# Patient Record
Sex: Female | Born: 1980 | Race: Black or African American | Hispanic: No | Marital: Married | Smoking: Current every day smoker
Health system: Southern US, Community
[De-identification: ages and names within clinical notes are randomized; demographics above are authoritative.]

## PROBLEM LIST (undated history)

## (undated) DIAGNOSIS — Z720 Tobacco use: Secondary | ICD-10-CM

## (undated) DIAGNOSIS — F319 Bipolar disorder, unspecified: Secondary | ICD-10-CM

## (undated) DIAGNOSIS — R569 Unspecified convulsions: Secondary | ICD-10-CM

## (undated) DIAGNOSIS — L0591 Pilonidal cyst without abscess: Secondary | ICD-10-CM

## (undated) HISTORY — PX: EYE SURGERY: SHX253

---

## 2005-12-14 ENCOUNTER — Emergency Department: Payer: Self-pay | Admitting: Emergency Medicine

## 2006-12-03 ENCOUNTER — Emergency Department: Payer: Self-pay | Admitting: Internal Medicine

## 2006-12-03 ENCOUNTER — Other Ambulatory Visit: Payer: Self-pay

## 2013-04-24 ENCOUNTER — Ambulatory Visit: Payer: Self-pay | Admitting: Family Medicine

## 2013-04-30 ENCOUNTER — Ambulatory Visit: Payer: Self-pay

## 2013-04-30 LAB — COMPREHENSIVE METABOLIC PANEL
ALK PHOS: 48 U/L
ALT: 51 U/L (ref 12–78)
AST: 65 U/L — AB (ref 15–37)
Albumin: 4.2 g/dL (ref 3.4–5.0)
Anion Gap: 11 (ref 7–16)
BILIRUBIN TOTAL: 0.9 mg/dL (ref 0.2–1.0)
BUN: 11 mg/dL (ref 7–18)
CHLORIDE: 102 mmol/L (ref 98–107)
CREATININE: 1.02 mg/dL (ref 0.60–1.30)
Calcium, Total: 9.2 mg/dL (ref 8.5–10.1)
Co2: 26 mmol/L (ref 21–32)
EGFR (Non-African Amer.): >60
Glucose: 89 mg/dL (ref 65–99)
OSMOLALITY: 276 (ref 275–301)
POTASSIUM: 4 mmol/L (ref 3.5–5.1)
Sodium: 139 mmol/L (ref 136–145)
Total Protein: 8.6 g/dL — ABNORMAL HIGH (ref 6.4–8.2)

## 2013-04-30 LAB — CBC WITH DIFFERENTIAL/PLATELET
Basophil #: 0 10*3/uL (ref 0.0–0.1)
Basophil %: 0.7 %
EOS ABS: 0.1 10*3/uL (ref 0.0–0.7)
Eosinophil %: 1.1 %
HCT: 38.7 % (ref 35.0–47.0)
HGB: 12.5 g/dL (ref 12.0–16.0)
LYMPHS ABS: 1.9 10*3/uL (ref 1.0–3.6)
LYMPHS PCT: 36.3 %
MCH: 29.8 pg (ref 26.0–34.0)
MCHC: 32.2 g/dL (ref 32.0–36.0)
MCV: 93 fL (ref 80–100)
MONOS PCT: 4.9 %
Monocyte #: 0.3 x10 3/mm (ref 0.2–0.9)
Neutrophil #: 3 10*3/uL (ref 1.4–6.5)
Neutrophil %: 57 %
Platelet: 229 10*3/uL (ref 150–440)
RBC: 4.18 10*6/uL (ref 3.80–5.20)
RDW: 12.7 % (ref 11.5–14.5)
WBC: 5.2 10*3/uL (ref 3.6–11.0)

## 2013-04-30 LAB — CK TOTAL AND CKMB (NOT AT ARMC)
CK, Total: 1929 U/L — ABNORMAL HIGH
CK-MB: <0.5 ng/mL — ABNORMAL LOW (ref 0.5–3.6)

## 2013-04-30 LAB — D-DIMER(ARMC): D-Dimer: 252 ng/ml

## 2015-04-06 DIAGNOSIS — H10022 Other mucopurulent conjunctivitis, left eye: Secondary | ICD-10-CM | POA: Diagnosis not present

## 2015-05-25 ENCOUNTER — Ambulatory Visit: Payer: Self-pay | Admitting: Family

## 2015-05-25 ENCOUNTER — Encounter: Payer: Self-pay | Admitting: Physician Assistant

## 2015-05-25 VITALS — BP 100/72 | HR 80 | Temp 98.2°F

## 2015-05-25 DIAGNOSIS — J392 Other diseases of pharynx: Secondary | ICD-10-CM

## 2015-05-25 NOTE — Progress Notes (Signed)
S/ c/o mild ST x 3-4 days without any fever ENT, RESP sxs   O/ VSS alert pleasant NAD ENT throat is clear, neck supple without nodes heart rsr lungs clear A/ throat irritation normal exam P / salt water gargles prn. F/U prn further problems

## 2015-06-01 ENCOUNTER — Emergency Department (HOSPITAL_COMMUNITY)
Admission: EM | Admit: 2015-06-01 | Discharge: 2015-06-01 | Disposition: A | Discharge status: Home / Self Care | Payer: 59 | Attending: Emergency Medicine | Admitting: Emergency Medicine

## 2015-06-01 ENCOUNTER — Encounter (HOSPITAL_COMMUNITY): Payer: Self-pay | Admitting: Emergency Medicine

## 2015-06-01 DIAGNOSIS — S29011A Strain of muscle and tendon of front wall of thorax, initial encounter: Secondary | ICD-10-CM | POA: Insufficient documentation

## 2015-06-01 DIAGNOSIS — Y9389 Activity, other specified: Secondary | ICD-10-CM | POA: Diagnosis not present

## 2015-06-01 DIAGNOSIS — F17200 Nicotine dependence, unspecified, uncomplicated: Secondary | ICD-10-CM | POA: Insufficient documentation

## 2015-06-01 DIAGNOSIS — Y9289 Other specified places as the place of occurrence of the external cause: Secondary | ICD-10-CM | POA: Insufficient documentation

## 2015-06-01 DIAGNOSIS — Y998 Other external cause status: Secondary | ICD-10-CM | POA: Insufficient documentation

## 2015-06-01 DIAGNOSIS — X58XXXA Exposure to other specified factors, initial encounter: Secondary | ICD-10-CM | POA: Insufficient documentation

## 2015-06-01 DIAGNOSIS — M25512 Pain in left shoulder: Secondary | ICD-10-CM | POA: Diagnosis present

## 2015-06-01 HISTORY — DX: Unspecified convulsions: R56.9

## 2015-06-01 MED ORDER — IBUPROFEN 600 MG PO TABS: 600.0000 mg | ORAL_TABLET | Freq: Four times a day (QID) | ORAL | Status: DC | PRN

## 2015-06-01 NOTE — Discharge Instructions (Signed)

## 2015-06-01 NOTE — ED Provider Notes (Signed)
CSN: [id number]     Arrival date & time 06/01/15  0909 History   First MD Initiated Contact with Patient 06/01/15 0920     Chief Complaint  Patient presents with  . Chest Pain     (Consider location/radiation/quality/duration/timing/severity/associated sxs/prior Treatment) Patient is a 35 y.o. female presenting with chest pain. The history is provided by the patient.  Chest Pain Pain location:  L lateral chest (medial to shoulder) Pain quality: aching   Pain radiates to:  Does not radiate Pain radiates to the back: no   Pain severity:  Moderate Onset quality:  Gradual Duration:  3 hours Timing:  Constant Progression:  Unchanged Chronicity:  Recurrent (same pain as when "tore my muscle" at end of last year) Context: not lifting and no movement   Relieved by:  Nothing Worsened by:  Nothing tried Ineffective treatments:  None tried Associated symptoms: no abdominal pain (but has indigestion), no cough, no fever, no nausea and not vomiting   Risk factors: smoking (0.5 ppd)   Risk factors: no diabetes mellitus, no high cholesterol and no hypertension     Past Medical History  Diagnosis Date  . Seizures (HCC)    No past surgical history on file. No family history on file. Social History  Substance Use Topics  . Smoking status: Current Some Day Smoker  . Smokeless tobacco: None  . Alcohol Use: None   OB History    No data available     Review of Systems  Constitutional: Negative for fever.  Respiratory: Negative for cough.   Cardiovascular: Positive for chest pain.  Gastrointestinal: Negative for nausea, vomiting and abdominal pain (but has indigestion).  All other systems reviewed and are negative.     Allergies  Review of patient's allergies indicates no known allergies.  Home Medications   Prior to Admission medications   Not on File   BP 121/81 mmHg  Pulse 84  Temp(Src) 98.4 F (36.9 C) (Oral)  Resp 16  Wt 127 lb (57.607 kg)  SpO2 100% Physical  Exam  Constitutional: She is oriented to person, place, and time. She appears well-developed and well-nourished. No distress.  HENT:  Head: Normocephalic.  Eyes: Conjunctivae are normal.  Neck: Neck supple. No tracheal deviation present.  Cardiovascular: Normal rate, regular rhythm and normal heart sounds.   Pulmonary/Chest: Effort normal and breath sounds normal. No respiratory distress. She has no wheezes. She has no rales.  Abdominal: Soft. She exhibits no distension.  Musculoskeletal:       Left shoulder: She exhibits tenderness (over left lateral pectoralis muscle with palpation and shoulder extension).  Neurological: She is alert and oriented to person, place, and time.  Skin: Skin is warm and dry.  Psychiatric: She has a normal mood and affect.  Vitals reviewed.   ED Course  Procedures (including critical care time) Labs Review Labs Reviewed - No data to display  Imaging Review No results found. I have personally reviewed and evaluated these images and lab results as part of my medical decision-making.   EKG Interpretation   Date/Time:  Wednesday Jun 01 2015 09:14:24 EDT Ventricular Rate:  91 PR Interval:  140 QRS Duration: 60 QT Interval:  348 QTC Calculation: 428 R Axis:   77 Text Interpretation:  Normal sinus rhythm Normal ECG No significant change  since last tracing Confirmed by Harmonie Verrastro MD, Chrsitopher Wik ([id number]) on 06/01/2015  9:22:51 AM      MDM   Final diagnoses:  Strain of left pectoralis muscle, initial encounter  35 y.o. female presents with left shoulder pain at her left upper chest. Tender to palpation of musculature which reproduces symptoms. Highly atypical for cardiac etiology and EKG is reassuring. Patient was recommended to take short course of scheduled NSAIDs and engage in early mobility as definitive treatment. Plan to follow up with PCP as needed and return precautions discussed for worsening or new concerning symptoms.     Lyndal Pulleyaniel Keyasha Miah,  MD 06/01/15 [phone]

## 2015-06-01 NOTE — ED Notes (Signed)
NAD at this time. Pt is going home.   

## 2015-06-01 NOTE — ED Notes (Signed)
Was cleaning shed Friday and sat and  This am left side cp started  this am had some sob  Fingers left hand are numb and tingling  States when she pushes on her chest it feels better denies n/v

## 2015-06-17 DIAGNOSIS — H5319 Other subjective visual disturbances: Secondary | ICD-10-CM | POA: Diagnosis not present

## 2015-06-17 DIAGNOSIS — H43392 Other vitreous opacities, left eye: Secondary | ICD-10-CM | POA: Diagnosis not present

## 2015-09-08 DIAGNOSIS — J0100 Acute maxillary sinusitis, unspecified: Secondary | ICD-10-CM | POA: Diagnosis not present

## 2015-09-08 DIAGNOSIS — Z6821 Body mass index (BMI) 21.0-21.9, adult: Secondary | ICD-10-CM | POA: Diagnosis not present

## 2015-09-26 DIAGNOSIS — Z6823 Body mass index (BMI) 23.0-23.9, adult: Secondary | ICD-10-CM | POA: Diagnosis not present

## 2015-09-26 DIAGNOSIS — L0501 Pilonidal cyst with abscess: Secondary | ICD-10-CM | POA: Diagnosis not present

## 2015-10-07 DIAGNOSIS — L0501 Pilonidal cyst with abscess: Secondary | ICD-10-CM | POA: Diagnosis not present

## 2015-10-17 DIAGNOSIS — L0501 Pilonidal cyst with abscess: Secondary | ICD-10-CM | POA: Diagnosis not present

## 2015-10-17 DIAGNOSIS — Z6822 Body mass index (BMI) 22.0-22.9, adult: Secondary | ICD-10-CM | POA: Diagnosis not present

## 2015-10-28 DIAGNOSIS — Z23 Encounter for immunization: Secondary | ICD-10-CM | POA: Diagnosis not present

## 2015-12-10 ENCOUNTER — Ambulatory Visit
Admission: EM | Admit: 2015-12-10 | Discharge: 2015-12-10 | Disposition: A | Discharge status: Home / Self Care | Payer: 59 | Attending: Family Medicine | Admitting: Family Medicine

## 2015-12-10 ENCOUNTER — Encounter: Payer: Self-pay | Admitting: Emergency Medicine

## 2015-12-10 DIAGNOSIS — J069 Acute upper respiratory infection, unspecified: Secondary | ICD-10-CM | POA: Diagnosis not present

## 2015-12-10 DIAGNOSIS — J029 Acute pharyngitis, unspecified: Secondary | ICD-10-CM

## 2015-12-10 LAB — RAPID STREP SCREEN (MED CTR MEBANE ONLY): Streptococcus, Group A Screen (Direct): NEGATIVE

## 2015-12-10 MED ORDER — HYDROCOD POLST-CPM POLST ER 10-8 MG/5ML PO SUER: 5.0000 mL | Freq: Every evening | ORAL | 0 refills | Status: DC | PRN

## 2015-12-10 MED ORDER — LIDOCAINE VISCOUS 2 % MT SOLN: 15.0000 mL | Freq: Three times a day (TID) | OROMUCOSAL | 0 refills | Status: DC | PRN

## 2015-12-10 NOTE — ED Provider Notes (Signed)
[facility] ____________________________________________  Time seen: Approximately 8:38 AM  I have reviewed the triage vital signs and the nursing notes.   HISTORY  Chief Complaint Sore Throat   HPI Joanne Tate is a 35 y.o. female presents for the complaints of 3-4 days of sore throat, cough and congestion. Patient reports symptoms unresolved with over-the-counter DayQuil. Denies fevers. Reports continues to eat and drink well. Reports has continued to remain active. Reports multiple sick contacts recently as she is frequently around young children. States cough intermittently does wake her up at night. States occasional production of mucus, but reports overall dry cough. States sore throat currently has somewhat improved since this morning sore throat.  Denies chest pain, shortness of breath, abdominal pain, dysuria, neck or back pain.  Patient's last menstrual period was 11/16/2015 (approximate). Denies pregnancy.   Past Medical History:  Diagnosis Date  . Seizures (HCC)     There are no active problems to display for this patient.   History reviewed. No pertinent surgical history.  Current Outpatient Rx  . Order #: [id number] Class: Print  . Order #: [id number] Class: Print  . Order #: [id number] Class: Normal    No current facility-administered medications for this encounter.   Current Outpatient Prescriptions:  .  chlorpheniramine-HYDROcodone (TUSSIONEX PENNKINETIC ER) 10-8 MG/5ML SUER, Take 5 mLs by mouth at bedtime as needed. do not drive or operate machinery while taking as can cause drowsiness., Disp: 75 mL, Rfl: 0 .  ibuprofen (ADVIL,MOTRIN) 600 MG tablet, Take 1 tablet (600 mg total) by mouth every 6 (six) hours as needed., Disp: 30 tablet, Rfl: 0 .  lidocaine (XYLOCAINE) 2 % solution, Use as directed 15 mLs in the mouth or throat every 8 (eight) hours as needed (sore throat. gargle and spit as needed for sore throat.)., Disp: 85 mL, Rfl:  0  Allergies Hydrocortisone  History reviewed. No pertinent family history.  Social History Social History  Substance Use Topics  . Smoking status: Current Some Day Smoker  . Smokeless tobacco: Current User  . Alcohol use 0.0 oz/week    Review of Systems Constitutional: No fever/chills Eyes: No visual changes. ENT: As above. Cardiovascular: Denies chest pain. Respiratory: Denies shortness of breath. Gastrointestinal: No abdominal pain.  No nausea, no vomiting.  No diarrhea.  No constipation. Genitourinary: Negative for dysuria. Musculoskeletal: Negative for back pain. Skin: Negative for rash. Neurological: Negative for headaches, focal weakness or numbness.  10-point ROS otherwise negative.  ____________________________________________   PHYSICAL EXAM:  VITAL SIGNS: ED Triage Vitals  Enc Vitals Group     BP 12/10/15 0827 108/73     Pulse Rate 12/10/15 0827 95     Resp 12/10/15 0827 16     Temp 12/10/15 0827 98.1 F (36.7 C)     Temp Source 12/10/15 0827 Oral     SpO2 12/10/15 0827 100 %     Weight 12/10/15 0827 131 lb (59.4 kg)     Height 12/10/15 0827 5\' 6"  (1.676 m)     Head Circumference --      Peak Flow --      Pain Score 12/10/15 0830 4     Pain Loc --      Pain Edu? --      Excl. in GC? --    Constitutional: Alert and oriented. Well appearing and in no acute distress. Eyes: Conjunctivae are normal. PERRL. EOMI. Head: Atraumatic. No sinus tenderness to palpation. No swelling. No erythema.  Ears: no erythema, normal TMs bilaterally.  Nose:Nasal congestion, no rhinorrhea.   Mouth/Throat: Mucous membranes are moist. Mild pharyngeal erythema. No tonsillar swelling or exudate.  Neck: No stridor.  No cervical spine tenderness to palpation. Hematological/Lymphatic/Immunilogical: No cervical lymphadenopathy. Cardiovascular: Normal rate, regular rhythm. Grossly normal heart sounds.  Good peripheral circulation. Respiratory: Normal respiratory effort.  No  retractions. Lungs CTAB.No wheezes, rales or rhonchi. Good air movement. Occasional dry cough noted in room.  Gastrointestinal: Soft and nontender.No CVA tenderness. Musculoskeletal: No lower or upper extremity tenderness nor edema. No cervical, thoracic or lumbar tenderness to palpation. Neurologic:  Normal speech and language. No gross focal neurologic deficits are appreciated. No gait instability. Skin:  Skin is warm, dry and intact. No rash noted. Psychiatric: Mood and affect are normal. Speech and behavior are normal. ___________________________________________   LABS (all labs ordered are listed, but only abnormal results are displayed)  Labs Reviewed  RAPID STRE[facility]EN [facility])  CULTURE, GROUP A STREP [facility])    RADIOLOGY  No results found. ____________________________________________   PROCEDURES Procedures   INITIAL IMPRESSION / ASSESSMENT AND PLAN / ED COURSE  Pertinent labs & imaging results that were available during my care of the patient were reviewed by me and considered in my medical decision making (see chart for details).  Well-appearing patient. No acute distress. States viral upper respiratory infection and viral pharyngitis. Will treat patient supportively. Over-the-counter Sudafed, prn viscous lidocaine gargles and when necessary Tussionex at night. Encouraged rest, fluids PCP follow up as needed.  Discussed follow up with Primary care physician this week. Discussed follow up and return parameters including no resolution or any worsening concerns. Patient verbalized understanding and agreed to plan.   ____________________________________________   FINAL CLINICAL IMPRESSION(S) / ED DIAGNOSES  Final diagnoses:  Upper respiratory tract infection, unspecified type  Pharyngitis, unspecified etiology     Discharge Medication List as of 12/10/2015  8:55 AM    START taking these medications   Details  chlorpheniramine-HYDROcodone (TUSSIONEX  PENNKINETIC ER) 10-8 MG/5ML SUER Take 5 mLs by mouth at bedtime as needed. do not drive or operate machinery while taking as can cause drowsiness., Starting Sat 12/10/2015, Print    lidocaine (XYLOCAINE) 2 % solution Use as directed 15 mLs in the mouth or throat every 8 (eight) hours as needed (sore throat. gargle and spit as needed for sore throat.)., Starting Sat 12/10/2015, Normal        Note: This dictation was prepared with Dragon dictation along with smaller phrase technology. Any transcriptional errors that result from this process are unintentional.    Clinical Course       Renford DillsLindsey Avanna Sowder, NP 12/10/15 [phone]

## 2015-12-10 NOTE — Discharge Instructions (Signed)
Take medication as prescribed. Rest. Drink plenty of fluids.  ° °Follow up with your primary care physician this week as needed. Return to Urgent care for new or worsening concerns.  ° °

## 2015-12-10 NOTE — ED Triage Notes (Signed)
Patient c/o sore throat, cough and chest congestion since yesterday.  Patient denies fevers.

## 2015-12-13 ENCOUNTER — Telehealth: Payer: Self-pay

## 2015-12-13 LAB — CULTURE, GROUP A STREP (THRC)

## 2015-12-13 NOTE — Telephone Encounter (Signed)
Courtesy call back completed today for patients recent visit at [facility]. Patient did not answer, left message on voicemail to call back with any questions or concerns.   

## 2016-02-13 DIAGNOSIS — F3161 Bipolar disorder, current episode mixed, mild: Secondary | ICD-10-CM | POA: Diagnosis not present

## 2016-02-13 DIAGNOSIS — Z6821 Body mass index (BMI) 21.0-21.9, adult: Secondary | ICD-10-CM | POA: Diagnosis not present

## 2017-12-04 ENCOUNTER — Ambulatory Visit
Admission: EM | Admit: 2017-12-04 | Discharge: 2017-12-04 | Disposition: A | Discharge status: Home / Self Care | Payer: 59 | Attending: Emergency Medicine | Admitting: Emergency Medicine

## 2017-12-04 ENCOUNTER — Other Ambulatory Visit: Payer: Self-pay

## 2017-12-04 DIAGNOSIS — J069 Acute upper respiratory infection, unspecified: Secondary | ICD-10-CM

## 2017-12-04 LAB — RAPID INFLUENZA A&B ANTIGENS (ARMC ONLY): INFLUENZA B (ARMC): NEGATIVE

## 2017-12-04 LAB — RAPID STREP SCREEN (MED CTR MEBANE ONLY): Streptococcus, Group A Screen (Direct): NEGATIVE

## 2017-12-04 LAB — RAPID INFLUENZA A&B ANTIGENS: Influenza A (ARMC): NEGATIVE

## 2017-12-04 MED ORDER — FLUTICASONE PROPIONATE 50 MCG/ACT NA SUSP: 2.0000 | Freq: Every day | NASAL | 0 refills | Status: DC

## 2017-12-04 MED ORDER — HYDROCOD POLST-CPM POLST ER 10-8 MG/5ML PO SUER: 5.0000 mL | Freq: Two times a day (BID) | ORAL | 0 refills | Status: DC

## 2017-12-04 MED ORDER — CETIRIZINE-PSEUDOEPHEDRINE ER 5-120 MG PO TB12: 1.0000 | ORAL_TABLET | Freq: Two times a day (BID) | ORAL | 0 refills | Status: DC

## 2017-12-04 MED ORDER — BENZONATATE 200 MG PO CAPS: ORAL_CAPSULE | ORAL | 0 refills | Status: DC

## 2017-12-04 NOTE — Discharge Instructions (Signed)
Drink plenty of fluids.  Get adequate rest.

## 2017-12-04 NOTE — ED Triage Notes (Signed)
Patient complains of cough, congestion, body aches, sinus pain and pressure, and sore throat that started yesterday.

## 2017-12-04 NOTE — ED Provider Notes (Signed)
[facility]    CSN: [id number] Arrival date & time: 12/04/17  1658     History   Chief Complaint Chief Complaint  Patient presents with  . Sore Throat    HPI Joanne Tate is a 37 y.o. female.   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  She did not receive a flu shot this year stating that she is afraid of needles.  Said chills but has no measurable fever today.  O2 sats 100%.  Her cough is mildly productive.  Denies wheezing or shortness of breath.       Past Medical History:  Diagnosis Date  . Seizures (HCC)     There are no active problems to display for this patient.   Past Surgical History:  Procedure Laterality Date  . EYE SURGERY Left    cornea laceration    OB History   None      Home Medications    Prior to Admission medications   Medication Sig Start Date End Date Taking? Authorizing Provider  sertraline (ZOLOFT) 50 MG tablet Take 50 mg by mouth daily. 09/02/17  Yes [provider]  valproic acid (DEPAKENE) 250 MG capsule Take 250 mg by mouth 2 (two) times daily. 09/02/17  Yes [provider]  benzonatate (TESSALON) 200 MG capsule Take one cap TID PRN cough 12/04/17   Lutricia Feiloemer, William P, PA-C  cetirizine-pseudoephedrine (ZYRTEC-D) 5-120 MG tablet Take 1 tablet by mouth 2 (two) times daily. 12/04/17   Lutricia Feiloemer, William P, PA-C  chlorpheniramine-HYDROcodone (TUSSIONEX PENNKINETIC ER) 10-8 MG/5ML SUER Take 5 mLs by mouth 2 (two) times daily. 12/04/17   Lutricia Feiloemer, William P, PA-C  fluticasone (FLONASE) 50 MCG/ACT nasal spray Place 2 sprays into both nostrils daily. 12/04/17   Lutricia Feiloemer, William P, PA-C    Family History History reviewed. No pertinent family history.  Social History Social History    Tobacco Use  . Smoking status: Current Some Day Smoker    Packs/day: 0.50    Types: Cigarettes  . Smokeless tobacco: Never Used  Substance Use Topics  . Alcohol use: Yes    Alcohol/week: 0.0 standard drinks    Comment: occasionally  . Drug use: No     Allergies   Hydrocortisone   Review of Systems Review of Systems  Constitutional: Positive for activity change, chills and fatigue. Negative for appetite change, diaphoresis and fever.  HENT: Positive for congestion, postnasal drip, sinus pressure, sinus pain and sore throat.   Respiratory: Positive for cough.   All other systems reviewed and are negative.    Physical Exam Triage Vital Signs ED Triage Vitals  Enc Vitals Group     BP 12/04/17 1722 104/74     Pulse Rate 12/04/17 1722 83     Resp 12/04/17 1722 18     Temp 12/04/17 1722 98 F (36.7 C)     Temp Source 12/04/17 1722 Oral     SpO2 12/04/17 1722 100 %     Weight 12/04/17 1719 132 lb (59.9 kg)     Height 12/04/17 1719 5\' 6"  (1.676 m)     Head Circumference --      Peak Flow --      Pain Score 12/04/17 1718 6     Pain Loc --  Pain Edu? --      Excl. in GC? --    No data found.  Updated Vital Signs BP 104/74 (BP Location: Left Arm)   Pulse 83   Temp 98 F (36.7 C) (Oral)   Resp 18   Ht 5\' 6"  (1.676 m)   Wt 132 lb (59.9 kg)   LMP 11/25/2017   SpO2 100%   BMI 21.31 kg/m   Visual Acuity Right Eye Distance:   Left Eye Distance:   Bilateral Distance:    Right Eye Near:   Left Eye Near:    Bilateral Near:     Physical Exam  Constitutional: She is oriented to person, place, and time. She appears well-developed and well-nourished.  Non-toxic appearance. She does not appear ill. No distress.  HENT:  Head: Normocephalic.  Right Ear: Hearing normal.  Left Ear: Hearing, tympanic membrane and ear canal normal.  Mouth/Throat: Uvula is midline, oropharynx is clear and moist and mucous membranes are normal. No oral lesions. No uvula swelling. No  oropharyngeal exudate, posterior oropharyngeal edema, posterior oropharyngeal erythema or tonsillar abscesses. Tonsils are 0 on the right. Tonsils are 0 on the left. No tonsillar exudate.  Right ear canal is occluded with cerumen  Eyes: Pupils are equal, round, and reactive to light.  Neck: Normal range of motion.  Pulmonary/Chest: Effort normal and breath sounds normal.  Lymphadenopathy:    She has no cervical adenopathy.  Neurological: She is alert and oriented to person, place, and time.  Skin: Skin is warm and dry.  Psychiatric: She has a normal mood and affect. Her behavior is normal.  Nursing note and vitals reviewed.    UC Treatments / Results  Labs (all labs ordered are listed, but only abnormal results are displayed) Labs Reviewed  RAPID STREP SCREEN ([facility] ONLY)  RAPID INFLUENZA A&B ANTIGENS ([facility] ONLY)  CULTURE, GROUP A STREP [facility])    EKG None  Radiology No results found.  Procedures Procedures (including critical care time)  Medications Ordered in UC Medications - No data to display  Initial Impression / Assessment and Plan / UC Course  I have reviewed the triage vital signs and the nursing notes.  Pertinent labs & imaging results that were available during my care of the patient were reviewed by me and considered in my medical decision making (see chart for details).     The patient that this is likely a viral illness and does not require antibiotics at this time.  Treat her symptomatically.  Flonase nasal spray.  Recommend Zyrtec-D for decongestion.  Provide her with cough suppression.  If she continues to worsen or is not improving she can return to our clinic for further evaluation.  Is out of work through Monday. Final Clinical Impressions(s) / UC Diagnoses   Final diagnoses:  Upper respiratory tract infection, unspecified type     Discharge Instructions     Drink plenty of fluids.  Get adequate rest.    ED Prescriptions     Medication Sig Dispense Auth. Provider   benzonatate (TESSALON) 200 MG capsule Take one cap TID PRN cough 30 capsule Lutricia Feil, PA-C   chlorpheniramine-HYDROcodone (TUSSIONEX PENNKINETIC ER) 10-8 MG/5ML SUER Take 5 mLs by mouth 2 (two) times daily. 115 mL Ovid Curd P, PA-C   cetirizine-pseudoephedrine (ZYRTEC-D) 5-120 MG tablet Take 1 tablet by mouth 2 (two) times daily. 30 tablet Ovid Curd P, PA-C   fluticasone (FLONASE) 50 MCG/ACT nasal spray Place 2 sprays into both nostrils  daily. 16 g Lutricia Feil, PA-C     Controlled Substance Prescriptions [place] Controlled Substance Registry consulted? Not Applicable   Lutricia Feil, PA-C 12/04/17 1801

## 2017-12-07 LAB — CULTURE, GROUP A STREP (THRC)

## 2017-12-08 ENCOUNTER — Telehealth: Payer: Self-pay | Admitting: Emergency Medicine

## 2017-12-08 MED ORDER — HYDROCOD POLST-CPM POLST ER 10-8 MG/5ML PO SUER: 5.0000 mL | Freq: Two times a day (BID) | ORAL | 0 refills | Status: DC

## 2017-12-08 NOTE — Telephone Encounter (Signed)
Notified by staff [facility], staff changed the pharmacy to [facility] [place].  Will write another prescription of Tussionex 5 mL twice a day #60 and send it to [facility].  Domenick GongAshley Keithan Dileonardo, MD.

## 2017-12-12 ENCOUNTER — Ambulatory Visit: Payer: Self-pay | Admitting: Obstetrics & Gynecology

## 2018-06-21 ENCOUNTER — Ambulatory Visit
Admission: EM | Admit: 2018-06-21 | Discharge: 2018-06-21 | Disposition: A | Discharge status: Home / Self Care | Payer: Self-pay | Attending: Family Medicine | Admitting: Family Medicine

## 2018-06-21 ENCOUNTER — Other Ambulatory Visit: Payer: Self-pay

## 2018-06-21 ENCOUNTER — Encounter: Payer: Self-pay | Admitting: Emergency Medicine

## 2018-06-21 DIAGNOSIS — H00011 Hordeolum externum right upper eyelid: Secondary | ICD-10-CM

## 2018-06-21 DIAGNOSIS — L03213 Periorbital cellulitis: Secondary | ICD-10-CM

## 2018-06-21 MED ORDER — SULFAMETHOXAZOLE-TRIMETHOPRIM 800-160 MG PO TABS: 1.0000 | ORAL_TABLET | Freq: Two times a day (BID) | ORAL | 0 refills | Status: DC

## 2018-06-21 NOTE — ED Triage Notes (Signed)
Patient c/o swelling, itching and tenderness in her right eye for the past 2 days.  Patient denies fever.

## 2018-06-21 NOTE — ED Provider Notes (Signed)
[facility]    CSN: [id number] Arrival date & time: 06/21/18  1011     History   Chief Complaint Chief Complaint  Patient presents with  . Eye Problem    HPI Joanne Tate is a 38 y.o. female.   38 yo female with a c/o swelling, itching and tenderness to her right upper eyelid for the past 2 days. States this morning she had some crustiness and drainage. Denies any trauma/injury to the eye, fevers, chills.      Past Medical History:  Diagnosis Date  . Seizures ([place])     There are no active problems to display for this patient.   Past Surgical History:  Procedure Laterality Date  . EYE SURGERY Left    cornea laceration    OB History   No obstetric history on file.      Home Medications    Prior to Admission medications   Medication Sig Start Date End Date Taking? Authorizing Provider  ALPRAZolam (XANAX) 0.25 MG tablet 1 TABLET(S) ORAL TWO TIMES A DAY AS NEEDED FOR ANXIETY 05/01/18  Yes [provider]  fluticasone (FLONASE) 50 MCG/ACT nasal spray Place 2 sprays into both nostrils daily. 12/04/17  Yes Lorin Picket, PA-C  sertraline (ZOLOFT) 50 MG tablet Take 50 mg by mouth daily. 09/02/17  Yes [provider]  valproic acid (DEPAKENE) 250 MG capsule Take 250 mg by mouth 2 (two) times daily. 09/02/17  Yes [provider]  benzonatate (TESSALON) 200 MG capsule Take one cap TID PRN cough 12/04/17   Lorin Picket, PA-C  cetirizine-pseudoephedrine (ZYRTEC-D) 5-120 MG tablet Take 1 tablet by mouth 2 (two) times daily. 12/04/17   Lorin Picket, PA-C  chlorpheniramine-HYDROcodone (TUSSIONEX PENNKINETIC ER) 10-8 MG/5ML SUER Take 5 mLs by mouth 2 (two) times daily. 12/08/17   Melynda Ripple, MD  sulfamethoxazole-trimethoprim (BACTRIM DS) 800-160 MG tablet Take 1 tablet by mouth 2 (two) times daily. 06/21/18   Norval Gable, MD    Family History Family History  Problem Relation Age of Onset  . Hypertension Mother    . Thyroid disease Mother   . Heart disease Mother     Social History Social History   Tobacco Use  . Smoking status: Current Some Day Smoker    Packs/day: 0.50    Types: Cigarettes  . Smokeless tobacco: Never Used  Substance Use Topics  . Alcohol use: Yes    Alcohol/week: 0.0 standard drinks    Comment: occasionally  . Drug use: No     Allergies   Hydrocortisone   Review of Systems Review of Systems   Physical Exam Triage Vital Signs ED Triage Vitals  Enc Vitals Group     BP 06/21/18 1033 110/76     Pulse Rate 06/21/18 1033 97     Resp 06/21/18 1033 16     Temp 06/21/18 1033 98.3 F (36.8 C)     Temp Source 06/21/18 1033 Oral     SpO2 06/21/18 1033 100 %     Weight 06/21/18 1028 127 lb (57.6 kg)     Height 06/21/18 1028 5\' 5"  (1.651 m)     Head Circumference --      Peak Flow --      Pain Score 06/21/18 1028 0     Pain Loc --      Pain Edu? --      Excl. in [place]? --    No data found.  Updated Vital Signs BP 110/76 (BP  Location: Left Arm)   Pulse 97   Temp 98.3 F (36.8 C) (Oral)   Resp 16   Ht 5\' 5"  (1.651 m)   Wt 57.6 kg   LMP 06/07/2018 (Approximate)   SpO2 100%   BMI 21.13 kg/m   Visual Acuity Right Eye Distance: 20/70 uncorrected Left Eye Distance: 20/100 uncorrected Bilateral Distance: 20/70 uncorrected  Right Eye Near:   Left Eye Near:    Bilateral Near:      Visual Acuity  Right Eye Distance: 20/70 uncorrected Left Eye Distance: 20/100 uncorrected Bilateral Distance: 20/70 uncorrected  Right Eye Near:   Left Eye Near:    Bilateral Near:    Physical Exam Vitals signs and nursing note reviewed.  Constitutional:      General: She is not in acute distress.    Appearance: She is not toxic-appearing or diaphoretic.  Eyes:     General:        Right eye: Hordeolum (right upper eyelid; with diffuse edema, erythema and tenderness to palpation; no drainage) present. No foreign body or discharge.     Extraocular Movements: Extraocular  movements intact.     Pupils: Pupils are equal, round, and reactive to light.  Neurological:     Mental Status: She is alert.      UC Treatments / Results  Labs (all labs ordered are listed, but only abnormal results are displayed) Labs Reviewed - No data to display  EKG None  Radiology No results found.  Procedures Procedures (including critical care time)  Medications Ordered in UC Medications - No data to display  Initial Impression / Assessment and Plan / UC Course  I have reviewed the triage vital signs and the nursing notes.  Pertinent labs & imaging results that were available during my care of the patient were reviewed by me and considered in my medical decision making (see chart for details).     Final Clinical Impressions(s) / UC Diagnoses   Final diagnoses:  Preseptal cellulitis of right eye  Hordeolum externum of right upper eyelid    ED Prescriptions    Medication Sig Dispense Auth. Provider   sulfamethoxazole-trimethoprim (BACTRIM DS) 800-160 MG tablet Take 1 tablet by mouth 2 (two) times daily. 20 tablet Payton Mccallumonty, Joseth Weigel, MD      1. diagnosis reviewed with patient 2. rx as per orders above; reviewed possible side effects, interactions, risks and benefits  3. Recommend supportive treatment with warm compresses to area   4. Follow-up prn if symptoms worsen or don't improve  Controlled Substance Prescriptions [place] Controlled Substance Registry consulted? Not Applicable   Payton Mccallumonty, Judith Campillo, MD 06/21/18 1147

## 2018-06-21 NOTE — Discharge Instructions (Addendum)
Warm compresses to affected area °

## 2018-10-19 ENCOUNTER — Other Ambulatory Visit: Payer: Self-pay

## 2018-10-19 ENCOUNTER — Ambulatory Visit
Admission: EM | Admit: 2018-10-19 | Discharge: 2018-10-19 | Disposition: A | Discharge status: Home / Self Care | Payer: Self-pay | Attending: Family Medicine | Admitting: Family Medicine

## 2018-10-19 DIAGNOSIS — H00011 Hordeolum externum right upper eyelid: Secondary | ICD-10-CM

## 2018-10-19 MED ORDER — DOXYCYCLINE HYCLATE 100 MG PO CAPS: 100.0000 mg | ORAL_CAPSULE | Freq: Two times a day (BID) | ORAL | 0 refills | Status: DC

## 2018-10-19 MED ORDER — POLYMYXIN B-TRIMETHOPRIM 10000-0.1 UNIT/ML-% OP SOLN: 1.0000 [drp] | Freq: Four times a day (QID) | OPHTHALMIC | 0 refills | Status: AC

## 2018-10-19 NOTE — Discharge Instructions (Addendum)
Warm compresses.  Antibiotic eye drop as prescribed.  If worsens (develop symptoms of preseptal celluitis) start the oral antibiotic.  Take care  Dr. Lacinda Axon

## 2018-10-19 NOTE — ED Provider Notes (Signed)
[facility]    CSN: [id number] Arrival date & time: 10/19/18  1230  History   Chief Complaint Chief Complaint  Patient presents with  . Eye Pain    right    HPI  38 year old female presents with the above complaint.  Patient has a history of preseptal cellulitis.  Patient is concerned that this is what is currently going on.  Patient reports a 3-day history of pain in the right eye, particular the right upper eyelid.  No drainage.  Patient reports that she is having pain "down to the bone".  No reports of surrounding swelling or erythema.  No fever.  Pain is 5/10 in severity.  She is concerned that she is having a recurrence of preseptal cellulitis.  No known inciting factor.  No known exacerbating factors.  No other complaints.  PMH, Surgical Hx, Family Hx, Social History reviewed and updated as below.  Past Medical History:  Diagnosis Date  . Seizures ([facility])   Bipolar disorder Hx of pilonidal cyst/abscess  Past Surgical History:  Procedure Laterality Date  . EYE SURGERY Left    cornea laceration    OB History   No obstetric history on file.      Home Medications    Prior to Admission medications   Medication Sig Start Date End Date Taking? Authorizing Provider  ALPRAZolam (XANAX) 0.25 MG tablet 1 TABLET(S) ORAL TWO TIMES A DAY AS NEEDED FOR ANXIETY 05/01/18  Yes [provider]  cetirizine-pseudoephedrine (ZYRTEC-D) 5-120 MG tablet Take 1 tablet by mouth 2 (two) times daily. 12/04/17  Yes Lutricia Feil, PA-C  sertraline (ZOLOFT) 50 MG tablet Take 50 mg by mouth daily. 09/02/17  Yes [provider]  valproic acid (DEPAKENE) 250 MG capsule Take 250 mg by mouth 2 (two) times daily. 09/02/17  Yes [provider]  doxycycline (VIBRAMYCIN) 100 MG capsule Take 1 capsule (100 mg total) by mouth 2 (two) times daily. 10/19/18   Tommie Sams, DO  trimethoprim-polymyxin b (POLYTRIM) ophthalmic solution Place 1 drop into the right eye  every 6 (six) hours for 5 days. 10/19/18 10/24/18  Tommie Sams, DO  fluticasone (FLONASE) 50 MCG/ACT nasal spray Place 2 sprays into both nostrils daily. 12/04/17 10/19/18  Lutricia Feil, PA-C    Family History Family History  Problem Relation Age of Onset  . Hypertension Mother   . Thyroid disease Mother   . Heart disease Mother     Social History Social History   Tobacco Use  . Smoking status: Current Some Day Smoker    Packs/day: 0.50    Types: Cigarettes  . Smokeless tobacco: Never Used  Substance Use Topics  . Alcohol use: Yes    Alcohol/week: 0.0 standard drinks    Comment: occasionally  . Drug use: No     Allergies   Hydrocortisone   Review of Systems Review of Systems  Constitutional: Negative.   Eyes: Positive for pain. Negative for discharge.   Physical Exam Triage Vital Signs ED Triage Vitals  Enc Vitals Group     BP 10/19/18 1256 99/61     Pulse Rate 10/19/18 1256 82     Resp 10/19/18 1256 16     Temp 10/19/18 1256 98.6 F (37 C)     Temp Source 10/19/18 1256 Oral     SpO2 10/19/18 1256 100 %     Weight 10/19/18 1253 136 lb (61.7 kg)     Height 10/19/18 1253 5\' 6"  (1.676 m)  Head Circumference --      Peak Flow --      Pain Score 10/19/18 1253 5     Pain Loc --      Pain Edu? --      Excl. in [place]? --    Updated Vital Signs BP 99/61 (BP Location: Left Arm)   Pulse 82   Temp 98.6 F (37 C) (Oral)   Resp 16   Ht 5\' 6"  (1.676 m)   Wt 61.7 kg   LMP 10/12/2018   SpO2 100%   BMI 21.95 kg/m   Visual Acuity Right Eye Distance: 20/40 Left Eye Distance: 20/70 Bilateral Distance: 20/25(uncorrected)  Right Eye Near:   Left Eye Near:    Bilateral Near:     Physical Exam Vitals signs and nursing note reviewed.  Constitutional:      General: She is not in acute distress.    Appearance: Normal appearance. She is not ill-appearing.  HENT:     Head: Normocephalic and atraumatic.     Nose: Nose normal.  Eyes:      Conjunctiva/sclera: Conjunctivae normal.     Comments: Right upper eyelid with large stye noted. No erythema or swelling surrounding the right eye.  No evidence of preseptal cellulitis at this time.  Cardiovascular:     Rate and Rhythm: Normal rate and regular rhythm.     Heart sounds: No murmur.  Pulmonary:     Effort: Pulmonary effort is normal.     Breath sounds: No wheezing, rhonchi or rales.  Skin:    General: Skin is warm.     Findings: No rash.  Neurological:     Mental Status: She is alert.  Psychiatric:        Behavior: Behavior normal.     Comments: Flat affect.  Depressed mood.    UC Treatments / Results  Labs (all labs ordered are listed, but only abnormal results are displayed) Labs Reviewed - No data to display  EKG   Radiology No results found.  Procedures Procedures (including critical care time)  Medications Ordered in UC Medications - No data to display  Initial Impression / Assessment and Plan / UC Course  I have reviewed the triage vital signs and the nursing notes.  Pertinent labs & imaging results that were available during my care of the patient were reviewed by me and considered in my medical decision making (see chart for details).    38 year old female presents with external hordeolum.  Warm compresses.  Polytrim as directed.  Has a history of preseptal cellulitis which has started with styes previously.  Doxycycline prescription given to be used if she fails to improve or worsens (i.e. develops signs and symptoms of preseptal cellulitis).  Final Clinical Impressions(s) / UC Diagnoses   Final diagnoses:  Hordeolum externum of right upper eyelid     Discharge Instructions     Warm compresses.  Antibiotic eye drop as prescribed.  If worsens (develop symptoms of preseptal celluitis) start the oral antibiotic.  Take care  Dr. Adriana Simasook    ED Prescriptions    Medication Sig Dispense Auth. Provider   trimethoprim-polymyxin b (POLYTRIM)  ophthalmic solution Place 1 drop into the right eye every 6 (six) hours for 5 days. 10 mL Jannifer Fischler G, DO   doxycycline (VIBRAMYCIN) 100 MG capsule Take 1 capsule (100 mg total) by mouth 2 (two) times daily. 14 capsule Everlene Otherook, Mubarak Bevens G, DO     PDMP not reviewed this encounter.   Adriana Simasook,  Verania Salberg G, DO 10/19/18 1344

## 2018-10-19 NOTE — ED Triage Notes (Signed)
Patient complains of right eye pain. Patient states that she is concerned that she may have preseptal cellulitis again. States that eye is hurting "down to the bone."

## 2019-10-14 ENCOUNTER — Ambulatory Visit
Admission: RE | Admit: 2019-10-14 | Discharge: 2019-10-14 | Discharge status: Absent without leave | Payer: Self-pay | Source: Ambulatory Visit

## 2019-10-14 ENCOUNTER — Ambulatory Visit (INDEPENDENT_AMBULATORY_CARE_PROVIDER_SITE_OTHER): Payer: Managed Care, Other (non HMO)

## 2019-10-14 ENCOUNTER — Other Ambulatory Visit: Payer: Self-pay

## 2019-10-14 ENCOUNTER — Ambulatory Visit
Admission: EM | Admit: 2019-10-14 | Discharge: 2019-10-14 | Disposition: A | Discharge status: Home / Self Care | Payer: Managed Care, Other (non HMO) | Attending: Physician Assistant | Admitting: Physician Assistant

## 2019-10-14 DIAGNOSIS — N760 Acute vaginitis: Secondary | ICD-10-CM

## 2019-10-14 DIAGNOSIS — M25552 Pain in left hip: Secondary | ICD-10-CM

## 2019-10-14 DIAGNOSIS — B9689 Other specified bacterial agents as the cause of diseases classified elsewhere: Secondary | ICD-10-CM

## 2019-10-14 DIAGNOSIS — S39012A Strain of muscle, fascia and tendon of lower back, initial encounter: Secondary | ICD-10-CM | POA: Diagnosis present

## 2019-10-14 HISTORY — DX: Bipolar disorder, unspecified: F31.9

## 2019-10-14 LAB — URINALYSIS, COMPLETE (UACMP) WITH MICROSCOPIC
Bilirubin Urine: NEGATIVE
Glucose, UA: NEGATIVE mg/dL
Hgb urine dipstick: NEGATIVE
Ketones, ur: NEGATIVE mg/dL
Leukocytes,Ua: NEGATIVE
Nitrite: NEGATIVE
Protein, ur: NEGATIVE mg/dL
RBC / HPF: NONE SEEN RBC/hpf (ref 0–5)
Specific Gravity, Urine: 1.025 (ref 1.005–1.030)
pH: 7 (ref 5.0–8.0)

## 2019-10-14 LAB — WET PREP, GENITAL
Sperm: NONE SEEN
Trich, Wet Prep: NONE SEEN
Yeast Wet Prep HPF POC: NONE SEEN

## 2019-10-14 LAB — CHLAMYDIA/NGC RT PCR (ARMC ONLY)
Chlamydia Tr: NOT DETECTED
N gonorrhoeae: NOT DETECTED

## 2019-10-14 MED ORDER — DICLOFENAC SODIUM 75 MG PO TBEC: 75.0000 mg | DELAYED_RELEASE_TABLET | Freq: Two times a day (BID) | ORAL | 0 refills | Status: AC

## 2019-10-14 MED ORDER — HYDROCODONE-ACETAMINOPHEN 5-325 MG PO TABS: 1.0000 | ORAL_TABLET | Freq: Four times a day (QID) | ORAL | 0 refills | Status: AC | PRN

## 2019-10-14 MED ORDER — METRONIDAZOLE 500 MG PO TABS: 500.0000 mg | ORAL_TABLET | Freq: Two times a day (BID) | ORAL | 0 refills | Status: AC

## 2019-10-14 MED ORDER — CYCLOBENZAPRINE HCL 10 MG PO TABS: 10.0000 mg | ORAL_TABLET | Freq: Three times a day (TID) | ORAL | 0 refills | Status: AC | PRN

## 2019-10-14 NOTE — Discharge Instructions (Addendum)
Your hip pain is likely due to strain of your back and also hip.  X-ray of the hip was performed today. X-ray was normal.  I suspect that you have strained your back and hip.  Since you had lymphadenopathy of the groin, we performed a urinalysis which was normal.  A wet prep test was also performed that shows that you have bacterial vaginal infection.  We have sent out testing for gonorrhea and chlamydia and will treat if positive.  BACK PAIN: Stressed avoiding painful activities . RICE (REST, ICE, COMPRESSION, ELEVATION) guidelines reviewed. May alternate ice and heat. Consider use of muscle rubs, Salonpas patches, etc. Use medications as directed including muscle relaxers if prescribed. Take anti-inflammatory medications as prescribed or OTC NSAIDs/Tylenol.  F/u with PCP in 7-10 days for reexamination, and please feel free to call or return to the urgent care at any time for any questions or concerns you may have and we will be happy to help you!   BACK PAIN RED FLAGS: If the back pain acutely worsens or there are any red flag symptoms such as numbness/tingling, leg weakness, saddle anesthesia, or loss of bowel/bladder control, go immediately to the ER. Follow up with [place] as scheduled or sooner if the pain does not begin to resolve or if it worsens before the follow up    If condition worsening or not improving, consider a follow up with Orthopedics so please call one of the following office for appointment:   [facility] [address] [place], [place] [address] Phone: [phone]  [facility] [address], [place], [place] [address] Phone: [phone]

## 2019-10-14 NOTE — ED Provider Notes (Signed)
[facility]    CSN: [id number] Arrival date & time: 10/14/19  0808      History   Chief Complaint Chief Complaint  Patient presents with  . Groin Pain    HPI Joanne Tate is a 39 y.o. female to female transgender individual presenting for left hip pain radiating to left groin and left lower back x 3 weeks. He says the pain is worsening. Admits to a lot of lifting at work and thinks that could have something to do with it. Has not taken anything for pain relief.  Patient says the pain is worse with bending forward and walking.  Patient denies pelvic pain.  Patient denies any dysuria, hematuria, vaginal discharge.  No concern for STIs.  Patient is not concerned for STIs since he is married to a female.  Denies any concern for pregnancy.  Has regular menstrual cycles with last menstrual cycle 09/29/2019.  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  Patient says he is not comfortable with pelvic exams and will need to be sedated for that.  Declines any pelvic exams today.  Patient says he had blood work done with PCP a couple months ago and it was all normal.  Patient denies any numbness, weakness, tingling in the leg.  No other concerns today.  HPI  Past Medical History:  Diagnosis Date  . Bipolar 1 disorder (HCC)   . Seizures (HCC)     There are no problems to display for this patient.   Past Surgical History:  Procedure Laterality Date  . EYE SURGERY Left    cornea laceration    OB History   No obstetric history on file.      Home Medications    Prior to Admission medications   Medication Sig Start Date End Date Taking? Authorizing Provider  ALPRAZolam (XANAX) 0.25 MG tablet 1 TABLET(S) ORAL TWO TIMES A DAY AS NEEDED FOR ANXIETY 05/01/18  Yes [provider]  cetirizine-pseudoephedrine (ZYRTEC-D) 5-120 MG tablet Take 1 tablet by mouth 2 (two) times  daily. 12/04/17  Yes Lutricia Feiloemer, William P, PA-C  sertraline (ZOLOFT) 50 MG tablet Take 50 mg by mouth daily. 09/02/17  Yes [provider]  valproic acid (DEPAKENE) 250 MG capsule Take 250 mg by mouth 3 (three) times daily.  09/02/17  Yes [provider]  cyclobenzaprine (FLEXERIL) 10 MG tablet Take 1 tablet (10 mg total) by mouth 3 (three) times daily as needed for up to 10 days for muscle spasms. 10/14/19 10/24/19  Shirlee LatchEaves, Lido Maske B, PA-C  diclofenac (VOLTAREN) 75 MG EC tablet Take 1 tablet (75 mg total) by mouth 2 (two) times daily for 15 days. 10/14/19 10/29/19  Shirlee LatchEaves, Cassadee Vanzandt B, PA-C  HYDROcodone-acetaminophen (NORCO/VICODIN) 5-325 MG tablet Take 1 tablet by mouth every 6 (six) hours as needed for up to 3 days for severe pain. 10/14/19 10/17/19  Eusebio FriendlyEaves, Johnny Latu B, PA-C  metroNIDAZOLE (FLAGYL) 500 MG tablet Take 1 tablet (500 mg total) by mouth 2 (two) times daily for 7 days. 10/14/19 10/21/19  Eusebio FriendlyEaves, Leathie Weich B, PA-C  fluticasone (FLONASE) 50 MCG/ACT nasal spray Place 2 sprays into both nostrils daily. 12/04/17 10/19/18  Lutricia Feiloemer, William P, PA-C    Family History Family History  Problem Relation Age of Onset  . Hypertension Mother   . Thyroid disease Mother   . Heart disease Mother     Social  History Social History   Tobacco Use  . Smoking status: Current Some Day Smoker    Packs/day: 0.50    Types: Cigarettes  . Smokeless tobacco: Never Used  Vaping Use  . Vaping Use: Never used  Substance Use Topics  . Alcohol use: Yes    Alcohol/week: 0.0 standard drinks    Comment: occasionally  . Drug use: No     Allergies   Hydrocortisone   Review of Systems Review of Systems  Constitutional: Negative for fatigue and fever.  Cardiovascular: Negative for chest pain and leg swelling.  Gastrointestinal: Negative for abdominal pain, nausea and vomiting.  Genitourinary: Negative for difficulty urinating, dysuria, genital sores, hematuria, pelvic pain, urgency, vaginal discharge and  vaginal pain.  Musculoskeletal: Positive for arthralgias and back pain. Negative for gait problem, joint swelling and myalgias.  Skin: Negative for rash and wound.  Neurological: Negative for weakness and numbness.     Physical Exam Triage Vital Signs ED Triage Vitals  Enc Vitals Group     BP 10/14/19 0838 112/76     Pulse Rate 10/14/19 0838 73     Resp 10/14/19 0838 18     Temp 10/14/19 0838 98.3 F (36.8 C)     Temp Source 10/14/19 0838 Oral     SpO2 10/14/19 0838 100 %     Weight --      Height --      Head Circumference --      Peak Flow --      Pain Score 10/14/19 0841 4     Pain Loc --      Pain Edu? --      Excl. in GC? --    No data found.  Updated Vital Signs BP 112/76 (BP Location: Right Arm)   Pulse 73   Temp 98.3 F (36.8 C) (Oral)   Resp 18   LMP 09/29/2019 Comment: denies preg, signed waiver  SpO2 100%        Physical Exam Vitals and nursing note reviewed.  Constitutional:      General: She is not in acute distress.    Appearance: Normal appearance. She is normal weight. She is not ill-appearing or toxic-appearing.  HENT:     Head: Normocephalic and atraumatic.     Nose: Nose normal.     Mouth/Throat:     Mouth: Mucous membranes are moist.     Pharynx: Oropharynx is clear.  Eyes:     General: No scleral icterus.       Right eye: No discharge.        Left eye: No discharge.     Conjunctiva/sclera: Conjunctivae normal.  Cardiovascular:     Rate and Rhythm: Normal rate and regular rhythm.     Heart sounds: Normal heart sounds.  Pulmonary:     Effort: Pulmonary effort is normal. No respiratory distress.     Breath sounds: Normal breath sounds.  Abdominal:     General: Bowel sounds are normal.     Palpations: Abdomen is soft.     Tenderness: There is no abdominal tenderness. There is no right CVA tenderness or left CVA tenderness.  Musculoskeletal:     Cervical back: Neck supple.     Lumbar back: Tenderness (left lumbar muscles) present. No  bony tenderness. Normal range of motion (painful forward flexion). Negative right straight leg raise test and negative left straight leg raise test.     Left hip: Bony tenderness (greater trocanter, anterior pelvis) present. Normal range of  motion. Normal strength.  Lymphadenopathy:     Lower Body: Right inguinal adenopathy present. Left inguinal adenopathy present.  Skin:    General: Skin is dry.  Neurological:     General: No focal deficit present.     Mental Status: She is alert. Mental status is at baseline.     Motor: No weakness.     Gait: Gait normal.  Psychiatric:        Mood and Affect: Mood normal.        Behavior: Behavior normal.        Thought Content: Thought content normal.      UC Treatments / Results  Labs (all labs ordered are listed, but only abnormal results are displayed) Labs Reviewed  WET PREP, GENITAL - Abnormal; Notable for the following components:      Result Value   Clue Cells Wet Prep HPF POC PRESENT (*)    WBC, Wet Prep HPF POC FEW (*)    All other components within normal limits  URINALYSIS, COMPLETE (UACMP) WITH MICROSCOPIC - Abnormal; Notable for the following components:   Bacteria, UA FEW (*)    All other components within normal limits  CHLAMYDIA/NGC RT PCR [facility] ONLY)    EKG   Radiology DG Hip Unilat W or Wo Pelvis 2-3 Views Left  Result Date: 10/14/2019 CLINICAL DATA:  Left hip pain. EXAM: DG HIP (WITH OR WITHOUT PELVIS) 2-3V LEFT COMPARISON:  None. FINDINGS: There is no evidence of hip fracture or dislocation. There is no evidence of arthropathy or other focal bone abnormality. IMPRESSION: Negative. Electronically Signed   By: Lupita Raider M.D.   On: 10/14/2019 09:39    Procedures Procedures (including critical care time)  Medications Ordered in UC Medications - No data to display  Initial Impression / Assessment and Plan / UC Course  I have reviewed the triage vital signs and the nursing notes.  Pertinent labs &  imaging results that were available during my care of the patient were reviewed by me and considered in my medical decision making (see chart for details).   Left hip pain is likely musculoskeletal.  Suspect lumbar strain and also hip strain.  X-ray of the hip performed since hip pain is worsening and has been going on for 3 weeks.  X-ray shows normal left hip. Advised patient that he should improve with anti-inflammatory medications and muscle relaxers. I also provided a short supply of hydrocodone for breakthrough pain. Advised if not improving over the next couple of weeks he should follow-up with orthopedics. Patient agreeable.  Patient has bilateral lymphadenopathy of the groin.  Declined blood work since he says he just had it done a couple months ago and was normal.  Urinalysis normal.  Wet prep shows positive clue cells.  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  GC/Chlamydia testing performed.  Await treatment if positive.  Advised patient to follow-up with PCP for the colposcopy and if he continues to have lymphadenopathy in the groin area after treatment of BV infection. GC/Chlamydia testing negative.   Final Clinical Impressions(s) / UC Diagnoses   Final diagnoses:  Acute hip pain, left  Strain of lumbar region, initial encounter  Bacterial vaginosis     Discharge Instructions     Your hip pain is likely due to strain of your back and also hip.  X-ray of the hip was performed today. X-ray was normal.  I suspect that you have strained your  back and hip.  Since you had lymphadenopathy of the groin, we performed a urinalysis which was normal.  A wet prep test was also performed that shows that you have bacterial vaginal infection.  We have sent out testing for gonorrhea and chlamydia and will treat if positive.  BACK PAIN: Stressed avoiding painful activities . RICE (REST, ICE, COMPRESSION, ELEVATION) guidelines reviewed. May  alternate ice and heat. Consider use of muscle rubs, Salonpas patches, etc. Use medications as directed including muscle relaxers if prescribed. Take anti-inflammatory medications as prescribed or OTC NSAIDs/Tylenol.  F/u with PCP in 7-10 days for reexamination, and please feel free to call or return to the urgent care at any time for any questions or concerns you may have and we will be happy to help you!   BACK PAIN RED FLAGS: If the back pain acutely worsens or there are any red flag symptoms such as numbness/tingling, leg weakness, saddle anesthesia, or loss of bowel/bladder control, go immediately to the ER. Follow up with [place] as scheduled or sooner if the pain does not begin to resolve or if it worsens before the follow up    If condition worsening or not improving, consider a follow up with Orthopedics so please call one of the following office for appointment:   [facility] [address] [place], [place] [address] Phone: [phone]  [facility] [address], [place], [place] [address] Phone: [phone]     ED Prescriptions    Medication Sig Dispense Auth. Provider   metroNIDAZOLE (FLAGYL) 500 MG tablet Take 1 tablet (500 mg total) by mouth 2 (two) times daily for 7 days. 14 tablet Eusebio Friendly B, PA-C   diclofenac (VOLTAREN) 75 MG EC tablet Take 1 tablet (75 mg total) by mouth 2 (two) times daily for 15 days. 30 tablet Eusebio Friendly B, PA-C   cyclobenzaprine (FLEXERIL) 10 MG tablet Take 1 tablet (10 mg total) by mouth 3 (three) times daily as needed for up to 10 days for muscle spasms. 30 tablet Shirlee Latch, PA-C   HYDROcodone-acetaminophen (NORCO/VICODIN) 5-325 MG tablet Take 1 tablet by mouth every 6 (six) hours as needed for up to 3 days for severe pain. 10 tablet Shirlee Latch, PA-C     I have reviewed the PDMP during this encounter.   Shirlee Latch, PA-C 10/14/19 1341

## 2019-10-14 NOTE — ED Triage Notes (Signed)
Pt reports L sided groin pain x two weeks.  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  Has tried ibuprofen, topical lidocaine with minimal relief. Is a constant pain exacerbated by positions.  Also reports periodic L calf pain.

## 2019-12-19 ENCOUNTER — Ambulatory Visit
Admission: EM | Admit: 2019-12-19 | Discharge: 2019-12-19 | Disposition: A | Discharge status: Home / Self Care | Payer: Managed Care, Other (non HMO) | Attending: Physician Assistant | Admitting: Physician Assistant

## 2019-12-19 ENCOUNTER — Other Ambulatory Visit: Payer: Self-pay

## 2019-12-19 ENCOUNTER — Encounter: Payer: Self-pay | Admitting: Emergency Medicine

## 2019-12-19 DIAGNOSIS — Z23 Encounter for immunization: Secondary | ICD-10-CM | POA: Diagnosis not present

## 2019-12-19 DIAGNOSIS — S61210A Laceration without foreign body of right index finger without damage to nail, initial encounter: Secondary | ICD-10-CM | POA: Diagnosis not present

## 2019-12-19 MED ORDER — TETANUS-DIPHTH-ACELL PERTUSSIS 5-2.5-18.5 LF-MCG/0.5 IM SUSY
0.5000 mL | PREFILLED_SYRINGE | Freq: Once | INTRAMUSCULAR | Status: AC
  Administered 2019-12-19: 0.5 mL via INTRAMUSCULAR

## 2019-12-19 NOTE — ED Triage Notes (Signed)
Patient in today after cutting her right index finger on a broken flower pot today at ~5am(12/19/19). Patient has not taken any OTC medications. Patient's last Tdap is unknown.

## 2019-12-19 NOTE — ED Provider Notes (Signed)
[facility]    CSN: [id number] Arrival date & time: 12/19/19  1201      History   Chief Complaint Chief Complaint  Patient presents with  . Laceration    Right index     HPI Joanne Tate is a 39 y.o. female to female transgender person presenting for right index finger laceration since earlier this morning.  He states that it was caught on a flower pot.  He said he cleaned it but it has continued to bleed a little bit.  He is unsure of his last tetanus immunization.  Patient denies any other injuries.  States there is an area of minor numbness around the laceration site.  Admits to full range of motion of the finger.  No history of any bleeding disorders and not taking any anticoagulants.  No other complaints or concerns.  HPI  Past Medical History:  Diagnosis Date  . Bipolar 1 disorder (HCC)   . Seizures (HCC)     There are no problems to display for this patient.   Past Surgical History:  Procedure Laterality Date  . EYE SURGERY Left    cornea laceration    OB History   No obstetric history on file.      Home Medications    Prior to Admission medications   Medication Sig Start Date End Date Taking? Authorizing Provider  ALPRAZolam (XANAX) 0.25 MG tablet 1 TABLET(S) ORAL TWO TIMES A DAY AS NEEDED FOR ANXIETY 05/01/18  Yes [provider]  cetirizine-pseudoephedrine (ZYRTEC-D) 5-120 MG tablet Take 1 tablet by mouth 2 (two) times daily. 12/04/17  Yes Lutricia Feil, PA-C  sertraline (ZOLOFT) 50 MG tablet Take 50 mg by mouth daily. 09/02/17  Yes [provider]  valproic acid (DEPAKENE) 250 MG capsule Take 250 mg by mouth 3 (three) times daily.  09/02/17  Yes [provider]  fluticasone (FLONASE) 50 MCG/ACT nasal spray Place 2 sprays into both nostrils daily. 12/04/17 10/19/18  Lutricia Feil, PA-C    Family History Family History  Problem Relation Age of Onset  . Hypertension Mother   . Thyroid disease Mother   .  Heart disease Mother   . Other Father        unknown medical history    Social History Social History   Tobacco Use  . Smoking status: Current Every Day Smoker    Packs/day: 0.50    Types: Cigarettes  . Smokeless tobacco: Never Used  Vaping Use  . Vaping Use: Never used  Substance Use Topics  . Alcohol use: Yes    Alcohol/week: 0.0 standard drinks    Comment: occasionally  . Drug use: No     Allergies   Cortisone and Hydrocortisone   Review of Systems Review of Systems  Musculoskeletal: Negative for arthralgias and joint swelling.  Skin: Positive for wound. Negative for color change and rash.  Neurological: Positive for numbness. Negative for weakness.  Hematological: Does not bruise/bleed easily.     Physical Exam Triage Vital Signs ED Triage Vitals  Enc Vitals Group     BP 12/19/19 1249 116/63     Pulse Rate 12/19/19 1249 92     Resp 12/19/19 1249 18     Temp 12/19/19 1249 98.1 F (36.7 C)     Temp Source 12/19/19 1249 Oral     SpO2 12/19/19 1249 100 %     Weight 12/19/19 1249 127 lb (57.6 kg)     Height 12/19/19 1249 5\' 5"  (  1.651 m)     Head Circumference --      Peak Flow --      Pain Score 12/19/19 1248 7     Pain Loc --      Pain Edu? --      Excl. in GC? --    No data found.  Updated Vital Signs BP 116/63 (BP Location: Left Arm)   Pulse 92   Temp 98.1 F (36.7 C) (Oral)   Resp 18   Ht 5\' 5"  (1.651 m)   Wt 127 lb (57.6 kg)   LMP 12/17/2019 (Exact Date)   SpO2 100%   BMI 21.13 kg/m       Physical Exam Vitals and nursing note reviewed.  Constitutional:      General: She is not in acute distress.    Appearance: Normal appearance. She is not ill-appearing or toxic-appearing.  HENT:     Head: Normocephalic and atraumatic.  Eyes:     General: No scleral icterus.       Right eye: No discharge.        Left eye: No discharge.     Conjunctiva/sclera: Conjunctivae normal.  Cardiovascular:     Rate and Rhythm: Normal rate and regular  rhythm.     Pulses: Normal pulses.  Pulmonary:     Effort: Pulmonary effort is normal. No respiratory distress.  Musculoskeletal:     Cervical back: Neck supple.  Skin:    General: Skin is dry.     Findings: Wound (1.5 cm superficial laceration right medial index finger about the PIP joint. No bleeding) present.  Neurological:     General: No focal deficit present.     Mental Status: She is alert. Mental status is at baseline.     Motor: No weakness.     Gait: Gait normal.  Psychiatric:        Mood and Affect: Mood normal.        Behavior: Behavior normal.        Thought Content: Thought content normal.      UC Treatments / Results  Labs (all labs ordered are listed, but only abnormal results are displayed) Labs Reviewed - No data to display  EKG   Radiology No results found.  Procedures Laceration Repair  Date/Time: 12/20/2019 7:49 AM Performed by: 14/12/2019, PA-C Authorized by: Shirlee Latch, PA-C   Consent:    Consent obtained:  Verbal   Consent given by:  Patient   Risks, benefits, and alternatives were discussed: yes     Risks discussed:  Infection, pain, poor cosmetic result, need for additional repair and poor wound healing   Alternatives discussed:  No treatment, delayed treatment, observation and referral Universal protocol:    Procedure explained and questions answered to patient or proxy's satisfaction: yes     Relevant documents present and verified: yes     Required blood products, implants, devices, and special equipment available: no     Patient identity confirmed:  Verbally with patient Anesthesia:    Anesthesia method:  None Laceration details:    Location:  Finger   Finger location:  R index finger   Length (cm):  1.5 Exploration:    Wound exploration: entire depth of wound visualized     Wound extent: no muscle damage noted, no nerve damage noted, no tendon damage noted, no underlying fracture noted and no vascular damage noted      Contaminated: yes (few specs of dirt flushed out with saline)  Treatment:    Area cleansed with:  Saline and chlorhexidine   Amount of cleaning:  Standard   Debridement:  None   Undermining:  None   Scar revision: no   Skin repair:    Repair method:  Tissue adhesive and Steri-Strips Approximation:    Approximation:  Close Repair type:    Repair type:  Simple Post-procedure details:    Dressing:  Adhesive bandage   Procedure completion:  Tolerated well, no immediate complications   (including critical care time)  Medications Ordered in UC Medications  Tdap (BOOSTRIX) injection 0.5 mL (0.5 mLs Intramuscular Given 12/19/19 1259)    Initial Impression / Assessment and Plan / UC Course  I have reviewed the triage vital signs and the nursing notes.  Pertinent labs & imaging results that were available during my care of the patient were reviewed by me and considered in my medical decision making (see chart for details).   Minor superficial laceration repaired with skin adhesive and 2 Steri-Strips.  Discussed wound care with patient.  Advised to follow-up as needed for any increased pain or signs of infection.  Work note given as not to use the right hand for a few days.  He has been placed in a finger splint since the laceration has about the PIP joint and if he bends it and that may disrupt the skin adhesive and Steri-Strips.  Patient agreeable.   Final Clinical Impressions(s) / UC Diagnoses   Final diagnoses:  Laceration of right index finger without foreign body without damage to nail, initial encounter   Discharge Instructions   None    ED Prescriptions    None     PDMP not reviewed this encounter.   Shirlee Latch, PA-C 12/20/19 [phone]

## 2019-12-20 DIAGNOSIS — S61210A Laceration without foreign body of right index finger without damage to nail, initial encounter: Secondary | ICD-10-CM | POA: Diagnosis not present

## 2020-01-09 ENCOUNTER — Encounter: Payer: Self-pay | Admitting: Gynecology

## 2020-01-09 ENCOUNTER — Other Ambulatory Visit: Payer: Self-pay

## 2020-01-09 ENCOUNTER — Ambulatory Visit
Admission: EM | Admit: 2020-01-09 | Discharge: 2020-01-09 | Disposition: A | Discharge status: Home / Self Care | Payer: Managed Care, Other (non HMO)

## 2020-01-09 DIAGNOSIS — R200 Anesthesia of skin: Secondary | ICD-10-CM | POA: Diagnosis not present

## 2020-01-09 NOTE — Discharge Instructions (Addendum)
The numbness and pain you are experiencing is most likely coming from the scar tissue underneath the scar from your laceration.  Take over-the-counter ibuprofen, 600 mg every 6 hours with food, as needed for pain.  Use moist heat 2-3 times a day to help with inflammation and pain control.  The best way to achieve this is with use of a paraffin bath.  This condition should correct itself with time.

## 2020-01-09 NOTE — ED Provider Notes (Signed)
[facility]    CSN: [id number] Arrival date & time: 01/09/20  0914      History   Chief Complaint Chief Complaint  Patient presents with  . Finger Injury    HPI Joanne Tate is a 40 y.o. female.   HPI   40 year old transgender female here for evaluation of continued pain and numbness on her right index finger after sustaining a laceration from a flowerpot.  Patient was evaluated on 12/19/2019 for laceration that was treated with adhesive and Steri-Strips.  There was a splint applied as well.  Patient states that she still experiences swelling at the PIP joint where the laceration occurred and that she has intermittent pain and numbness to the inner aspect of her right index finger.  Past Medical History:  Diagnosis Date  . Bipolar 1 disorder (HCC)   . Seizures (HCC)     There are no problems to display for this patient.   Past Surgical History:  Procedure Laterality Date  . EYE SURGERY Left    cornea laceration    OB History   No obstetric history on file.      Home Medications    Prior to Admission medications   Medication Sig Start Date End Date Taking? Authorizing Provider  ALPRAZolam (XANAX) 0.25 MG tablet 1 TABLET(S) ORAL TWO TIMES A DAY AS NEEDED FOR ANXIETY 05/01/18  Yes [provider]  sertraline (ZOLOFT) 50 MG tablet Take 50 mg by mouth daily. 09/02/17  Yes [provider]  valproic acid (DEPAKENE) 250 MG capsule Take 250 mg by mouth 3 (three) times daily.  09/02/17  Yes [provider]  cetirizine-pseudoephedrine (ZYRTEC-D) 5-120 MG tablet Take 1 tablet by mouth 2 (two) times daily. 12/04/17   Lutricia Feil, PA-C  fluticasone (FLONASE) 50 MCG/ACT nasal spray Place 2 sprays into both nostrils daily. 12/04/17 10/19/18  Lutricia Feil, PA-C    Family History Family History  Problem Relation Age of Onset  . Hypertension Mother   . Thyroid disease Mother   . Heart disease Mother   . Other Father         unknown medical history    Social History Social History   Tobacco Use  . Smoking status: Current Every Day Smoker    Packs/day: 0.50    Types: Cigarettes  . Smokeless tobacco: Never Used  Vaping Use  . Vaping Use: Never used  Substance Use Topics  . Alcohol use: Yes    Alcohol/week: 0.0 standard drinks    Comment: occasionally  . Drug use: No     Allergies   Cortisone and Hydrocortisone   Review of Systems Review of Systems  Musculoskeletal: Positive for arthralgias and joint swelling.  Skin: Negative for color change.  Neurological: Positive for numbness.     Physical Exam Triage Vital Signs ED Triage Vitals  Enc Vitals Group     BP 01/09/20 1004 97/72     Pulse Rate 01/09/20 1004 91     Resp 01/09/20 1004 16     Temp 01/09/20 1004 98.5 F (36.9 C)     Temp Source 01/09/20 1004 Oral     SpO2 01/09/20 1004 100 %     Weight 01/09/20 1005 127 lb (57.6 kg)     Height --      Head Circumference --      Peak Flow --      Pain Score 01/09/20 1005 4     Pain Loc --  Pain Edu? --      Excl. in GC? --    No data found.  Updated Vital Signs BP 97/72 (BP Location: Left Arm)   Pulse 91   Temp 98.5 F (36.9 C) (Oral)   Resp 16   Wt 127 lb (57.6 kg)   LMP 12/17/2019 (Exact Date)   SpO2 100%   BMI 21.13 kg/m   Visual Acuity Right Eye Distance:   Left Eye Distance:   Bilateral Distance:    Right Eye Near:   Left Eye Near:    Bilateral Near:     Physical Exam Vitals and nursing note reviewed.  Constitutional:      Appearance: Normal appearance. She is normal weight.  HENT:     Head: Normocephalic and atraumatic.  Musculoskeletal:        General: Tenderness present. No swelling or deformity. Normal range of motion.  Skin:    General: Skin is warm and dry.     Findings: No bruising, erythema or rash.  Neurological:     General: No focal deficit present.     Mental Status: She is alert and oriented to person, place, and time.  Psychiatric:         Mood and Affect: Mood normal.        Behavior: Behavior normal.        Thought Content: Thought content normal.        Judgment: Judgment normal.      UC Treatments / Results  Labs (all labs ordered are listed, but only abnormal results are displayed) Labs Reviewed - No data to display  EKG   Radiology No results found.  Procedures Procedures (including critical care time)  Medications Ordered in UC Medications - No data to display  Initial Impression / Assessment and Plan / UC Course  I have reviewed the triage vital signs and the nursing notes.  Pertinent labs & imaging results that were available during my care of the patient were reviewed by me and considered in my medical decision making (see chart for details).   Patient is here for evaluation of numbness and pain to her right index finger.  Patient was treated for laceration almost 3 weeks ago.  The pain and numbness is on the medial aspect of the right index finger.  The laceration occurred over the medial aspect of the PIP joint of the right index finger.  There is no erythema, edema, or drainage.  The laceration is well healed.  There is scar tissue palpable underneath the scar line.  Suspect that the scar tissue is putting pressure on the median nerve of the right hand.  Patient advised to take over-the-counter ibuprofen and utilize moist heat, such as a paraffin bath, twice daily for pain relief.  Patient advised that this will resolve with time.   Final Clinical Impressions(s) / UC Diagnoses   Final diagnoses:  Numbness of finger     Discharge Instructions     The numbness and pain you are experiencing is most likely coming from the scar tissue underneath the scar from your laceration.  Take over-the-counter ibuprofen, 600 mg every 6 hours with food, as needed for pain.  Use moist heat 2-3 times a day to help with inflammation and pain control.  The best way to achieve this is with use of a paraffin  bath.  This condition should correct itself with time.    ED Prescriptions    None     PDMP not reviewed  this encounter.   Margarette Canada, NP 01/09/20 1043

## 2020-01-09 NOTE — ED Triage Notes (Signed)
Per patient was seen on 12/19/19 for right index finger laceration. Pt. Stated having  Pain and numbness at finger.

## 2020-03-14 ENCOUNTER — Encounter: Payer: Self-pay | Admitting: Obstetrics

## 2020-06-22 ENCOUNTER — Ambulatory Visit
Admission: EM | Admit: 2020-06-22 | Discharge: 2020-06-22 | Disposition: A | Discharge status: Home / Self Care | Payer: Managed Care, Other (non HMO) | Attending: Family Medicine | Admitting: Family Medicine

## 2020-06-22 ENCOUNTER — Other Ambulatory Visit: Payer: Self-pay

## 2020-06-22 DIAGNOSIS — R0789 Other chest pain: Secondary | ICD-10-CM

## 2020-06-22 MED ORDER — MELOXICAM 15 MG PO TABS: 15.0000 mg | ORAL_TABLET | Freq: Every day | ORAL | 0 refills | Status: DC | PRN

## 2020-06-22 NOTE — ED Provider Notes (Signed)
[facility]    CSN: [id number] Arrival date & time: 06/22/20  1523      History   Chief Complaint Chief Complaint  Patient presents with   Chest Pain    HPI  40 year old female presents with chest pain.  Started this morning.  Located on the left side of the chest.  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  She is concerned that she may have pulled a muscle.  She states that the pain radiates to her left arm.  She localizes the pain to the left bicep region.  Also reports some numbness and tingling in her fourth and fifth digits.  She denies shortness of breath.  No diaphoresis.  No relieving factors.  No recent travel.  No history of DVT or PE.  Patient states that she thought it be best that she come in for evaluation.  No other complaints or concerns at this time.   Past Medical History:  Diagnosis Date   Bipolar 1 disorder (HCC)    Seizures (HCC)    Past Surgical History:  Procedure Laterality Date   EYE SURGERY Left    cornea laceration    OB History   No obstetric history on file.      Home Medications    Prior to Admission medications   Medication Sig Start Date End Date Taking? Authorizing Provider  ALPRAZolam (XANAX) 0.25 MG tablet 1 TABLET(S) ORAL TWO TIMES A DAY AS NEEDED FOR ANXIETY 05/01/18  Yes [provider]  cetirizine-pseudoephedrine (ZYRTEC-D) 5-120 MG tablet Take 1 tablet by mouth 2 (two) times daily. 12/04/17  Yes Lutricia Feil, PA-C  meloxicam (MOBIC) 15 MG tablet Take 1 tablet (15 mg total) by mouth daily as needed for pain. 06/22/20  Yes Hillarie Harrigan G, DO  sertraline (ZOLOFT) 50 MG tablet Take 50 mg by mouth daily. 09/02/17  Yes [provider]  valproic acid (DEPAKENE) 250 MG capsule Take 250 mg by mouth 3 (three) times daily.  09/02/17  Yes [provider]  fluticasone (FLONASE) 50 MCG/ACT nasal spray Place 2 sprays into both nostrils daily. 12/04/17 10/19/18  Lutricia Feil, PA-C     Family History Family History  Problem Relation Age of Onset   Hypertension Mother    Thyroid disease Mother    Heart disease Mother    Other Father        unknown medical history    Social History Social History   Tobacco Use   Smoking status: Every Day    Packs/day: 0.50    Pack years: 0.00    Types: Cigarettes   Smokeless tobacco: Never  Vaping Use   Vaping Use: Never used  Substance Use Topics   Alcohol use: Yes    Alcohol/week: 0.0 standard drinks    Comment: occasionally   Drug use: No     Allergies   Cortisone and Hydrocortisone   Review of Systems Review of Systems  Constitutional: Negative.   Respiratory: Negative.    Cardiovascular:  Positive for chest pain.   Physical Exam Triage Vital Signs ED Triage Vitals  Enc Vitals Group     BP 06/22/20 1539 112/66     Pulse Rate 06/22/20 1539 87     Resp 06/22/20 1539 16     Temp 06/22/20 1539 98.2 F (36.8 C)     Temp Source 06/22/20 1539 Oral     SpO2 06/22/20 1539 99 %     Weight 06/22/20 1537 138 lb (  62.6 kg)     Height 06/22/20 1537 5\' 5"  (1.651 m)     Head Circumference --      Peak Flow --      Pain Score 06/22/20 1537 7     Pain Loc --      Pain Edu? --      Excl. in GC? --     Updated Vital Signs BP 112/66 (BP Location: Left Arm)   Pulse 87   Temp 98.2 F (36.8 C) (Oral)   Resp 16   Ht 5\' 5"  (1.651 m)   Wt 62.6 kg   LMP 06/08/2020   SpO2 99%   BMI 22.96 kg/m   Visual Acuity Right Eye Distance:   Left Eye Distance:   Bilateral Distance:    Right Eye Near:   Left Eye Near:    Bilateral Near:     Physical Exam Vitals and nursing note reviewed.  Constitutional:      General: He is not in acute distress.    Appearance: Normal appearance. He is not ill-appearing.  HENT:     Head: Normocephalic and atraumatic.  Eyes:     General:        Right eye: No discharge.        Left eye: No discharge.     Conjunctiva/sclera: Conjunctivae normal.  Cardiovascular:     Rate and  Rhythm: Normal rate and regular rhythm.  Pulmonary:     Effort: Pulmonary effort is normal.     Breath sounds: Normal breath sounds.  Chest:     Chest wall: Tenderness present.  Neurological:     Mental Status: He is alert.  Psychiatric:        Mood and Affect: Mood normal.        Behavior: Behavior normal.     UC Treatments / Results  Labs (all labs ordered are listed, but only abnormal results are displayed) Labs Reviewed - No data to display  EKG Normal sinus rhythm with a rate of 80.  Possible LAE.  Normal axis.  No appreciable ST or T wave changes.  Radiology No results found.  Procedures Procedures (including critical care time)  Medications Ordered in UC Medications - No data to display  Initial Impression / Assessment and Plan / UC Course  I have reviewed the triage vital signs and the nursing notes.  Pertinent labs & imaging results that were available during my care of the patient were reviewed by me and considered in my medical decision making (see chart for details).    40 year old female presents with chest pain.  This is likely chest wall pain.  She is tender on palpation.  She is a smoker but has no history of hypertension and has no other reported risk factors.  No history of DVT or PE.  No recent travel.  Treating with meloxicam.  Return precautions given.  Supportive care.  Final Clinical Impressions(s) / UC Diagnoses   Final diagnoses:  Atypical chest pain  Chest wall pain     Discharge Instructions      Rest.  Heat.  Medication as prescribed.  If you worsen, please go to the hospital. Your EKG and presentation are not consistent with cardiac or other life threatening issue at this time.   Take care  Dr. 08/08/2020    ED Prescriptions     Medication Sig Dispense Auth. Provider   meloxicam (MOBIC) 15 MG tablet Take 1 tablet (15 mg total) by mouth daily as needed  for pain. 30 tablet Tommie Sams, DO      PDMP not reviewed this  encounter.   Tommie Sams, [place] 06/22/20 1704

## 2020-06-22 NOTE — ED Triage Notes (Signed)
Patient complains of chest pain that started around 6am. States that pain has been intermittent. Reports that pain hurts and radiates to left arm.

## 2020-06-22 NOTE — Discharge Instructions (Addendum)
Rest.  Heat.  Medication as prescribed.  If you worsen, please go to the hospital. Your EKG and presentation are not consistent with cardiac or other life threatening issue at this time.   Take care  Dr. Adriana Simas

## 2020-07-18 ENCOUNTER — Other Ambulatory Visit: Payer: Self-pay | Admitting: Family Medicine

## 2021-12-05 ENCOUNTER — Other Ambulatory Visit: Payer: Self-pay | Admitting: Family Medicine

## 2021-12-05 DIAGNOSIS — Z872 Personal history of diseases of the skin and subcutaneous tissue: Secondary | ICD-10-CM

## 2021-12-05 DIAGNOSIS — N63 Unspecified lump in unspecified breast: Secondary | ICD-10-CM

## 2021-12-05 DIAGNOSIS — N644 Mastodynia: Secondary | ICD-10-CM

## 2021-12-31 ENCOUNTER — Ambulatory Visit
Admission: EM | Admit: 2021-12-31 | Discharge: 2021-12-31 | Disposition: A | Discharge status: Home / Self Care | Payer: Managed Care, Other (non HMO) | Attending: Emergency Medicine | Admitting: Emergency Medicine

## 2021-12-31 DIAGNOSIS — J069 Acute upper respiratory infection, unspecified: Secondary | ICD-10-CM

## 2021-12-31 MED ORDER — BENZONATATE 100 MG PO CAPS: 100.0000 mg | ORAL_CAPSULE | Freq: Three times a day (TID) | ORAL | 0 refills | Status: DC

## 2021-12-31 NOTE — ED Provider Notes (Signed)
[facility] [place]   Name: Joanne Tate DOB: 12/15/1980 MRN: [id number] CSN: [id number] PCP: Dortha Kern, MD  Arrival date and time:  12/31/21 1020  Chief Complaint:  Cough (Body aches,  stomach discomfort - Entered by patient)   NOTE: Prior to seeing the patient today, I have reviewed the triage nursing documentation and vital signs. Clinical staff has updated patient's PMH/PSHx, current medication list, and drug allergies/intolerances to ensure comprehensive history available to assist in medical decision making.   History:   HPI: Joanne Tate is a 41 y.o. adult who presents today with complaints of cough, body aches, chills and nasal congestion.  She states the symptoms started 4 to very days ago.  With the cough worsening over the past 24 hours.  Her cough has become so intense that it causes pain to her upper back when coughing.  Her cough is productive.  She denies any wheezing or shortness of breath.  Though she feels feverish, she has not recorded a temperature.  Temperature rechecked today resulted at 98 F.   Past Medical History:  Diagnosis Date   Bipolar 1 disorder (HCC)    Seizures (HCC)     Past Surgical History:  Procedure Laterality Date   EYE SURGERY Left    cornea laceration    Family History  Problem Relation Age of Onset   Hypertension Mother    Thyroid disease Mother    Heart disease Mother    Other Father        unknown medical history    Social History   Tobacco Use   Smoking status: Every Day    Packs/day: 0.50    Types: Cigarettes   Smokeless tobacco: Never  Vaping Use   Vaping Use: Never used  Substance Use Topics   Alcohol use: Yes    Alcohol/week: 0.0 standard drinks of alcohol    Comment: occasionally   Drug use: No    There are no problems to display for this patient.   Home Medications:    No outpatient medications have been marked as taking for the 12/31/21 encounter [facility] Encounter).     Allergies:   Cortisone and Hydrocortisone  Review of Systems (ROS): Review of Systems  Constitutional:  Positive for chills and fatigue. Negative for activity change, appetite change and diaphoresis.  HENT:  Positive for postnasal drip and sore throat. Negative for congestion, ear pain, sinus pressure, sinus pain and sneezing.   Respiratory:  Positive for cough. Negative for shortness of breath and wheezing.   Cardiovascular:  Negative for chest pain.  Gastrointestinal:  Negative for diarrhea, nausea and vomiting.  All other systems reviewed and are negative.    Vital Signs: Today's Vitals   12/31/21 1251 12/31/21 1252  BP:  114/72  Pulse:  93  Resp:  18  Temp:  (!) 97.3 F (36.3 C)  TempSrc:  Oral  SpO2:  100%  Weight:  137 lb (62.1 kg)  Height:  5\' 5"  (1.651 m)  PainSc: 3      Physical Exam: Physical Exam Vitals and nursing note reviewed.  Constitutional:      Appearance: Normal appearance. He is ill-appearing. He is not toxic-appearing or diaphoretic.  HENT:     Head: Normocephalic.     Right Ear: Tympanic membrane normal. No middle ear effusion. Tympanic membrane is not erythematous.     Left Ear: Tympanic membrane normal.  No middle ear effusion. Tympanic membrane is not erythematous.  Nose: Congestion present.     Right Sinus: No maxillary sinus tenderness or frontal sinus tenderness.     Left Sinus: No maxillary sinus tenderness or frontal sinus tenderness.     Mouth/Throat:     Mouth: Mucous membranes are moist.     Pharynx: Posterior oropharyngeal erythema present.     Tonsils: No tonsillar exudate or tonsillar abscesses.  Cardiovascular:     Rate and Rhythm: Normal rate and regular rhythm.     Heart sounds: Normal heart sounds.  Pulmonary:     Effort: Pulmonary effort is normal.     Breath sounds: Normal breath sounds.  Musculoskeletal:     Cervical back: Normal range of motion.  Lymphadenopathy:     Cervical: No cervical adenopathy.  Skin:     General: Skin is warm and dry.  Neurological:     Mental Status: He is alert.      Urgent Care Treatments / Results:   LABS: PLEASE NOTE: all labs that were ordered this encounter are listed, however only abnormal results are displayed. Labs Reviewed - No data to display  EKG: -None  RADIOLOGY: No results found.  PROCEDURES: Procedures  MEDICATIONS RECEIVED THIS VISIT: Medications - No data to display  PERTINENT CLINICAL COURSE NOTES/UPDATES:   Initial Impression / Assessment and Plan / Urgent Care Course:  Pertinent labs & imaging results that were available during my care of the patient were personally reviewed by me and considered in my medical decision making (see lab/imaging section of note for values and interpretations).  Joanne Tate is a 41 y.o. female who presents to [facility] today with complaints of cough and bodyaches, diagnosed with viral URI with cough, and treated as such with the medications below. NP and patient reviewed discharge instructions below during visit.   Patient is well appearing overall in clinic today. He does not appear to be in any acute distress. Presenting symptoms (see HPI) and exam as documented above.   I have reviewed the follow up and strict return precautions for any new or worsening symptoms. Patient is aware of symptoms that would be deemed urgent/emergent, and would thus require further evaluation either here or in the emergency department. At the time of discharge, he verbalized understanding and consent with the discharge plan as it was reviewed with him. All questions were fielded by provider and/or clinic staff prior to patient discharge.    Final Clinical Impressions / Urgent Care Diagnoses:   Final diagnoses:  Viral URI with cough    New Prescriptions:  [place] Controlled Substance Registry consulted? Not Applicable  Meds ordered this encounter  Medications   benzonatate (TESSALON) 100 MG capsule    Sig: Take 1  capsule (100 mg total) by mouth every 8 (eight) hours.    Dispense:  21 capsule    Refill:  0      Discharge Instructions      You were seen for cough and sinus congestion and are being treated for viral URI.   - Use antihistamines, such as cetirizine (Zyrtec) or loratadine (Claritin), to help with the drainage.   - Over-the-counter Flonase 2 times a day will also help with sinus pressure.   - Treat your pain with over-the-counter Tylenol and ibuprofen as needed. - Monitor your symptoms to make sure that they get better.  If your symptoms do not get better in a week since they started, or they get worse after a week, you may need to get reevaluated.  -  You can take the prescription cough medication with any of the over-the-counter medications listed above. --- Take care, Dr. Marland Kitchen, NP-c      Recommended Follow up Care:  Patient encouraged to follow up with the following provider within the specified time frame, or sooner as dictated by the severity of his symptoms. As always, he was instructed that for any urgent/emergent care needs, he should seek care either here or in the emergency department for more immediate evaluation.   Gertie Baron, DNP, NP-c   Gertie Baron, NP 12/31/21 1339

## 2021-12-31 NOTE — ED Triage Notes (Addendum)
Client present with upper pain in back x day 5 and cough day 2.

## 2021-12-31 NOTE — Discharge Instructions (Signed)
You were seen for cough and sinus congestion and are being treated for viral URI.   - Use antihistamines, such as cetirizine (Zyrtec) or loratadine (Claritin), to help with the drainage.   - Over-the-counter Flonase 2 times a day will also help with sinus pressure.   - Treat your pain with over-the-counter Tylenol and ibuprofen as needed. - Monitor your symptoms to make sure that they get better.  If your symptoms do not get better in a week since they started, or they get worse after a week, you may need to get reevaluated.  -You can take the prescription cough medication with any of the over-the-counter medications listed above. --- Take care, Dr. Sharlet Salina, NP-c

## 2022-01-09 ENCOUNTER — Ambulatory Visit
Admission: RE | Admit: 2022-01-09 | Discharge: 2022-01-09 | Disposition: A | Discharge status: Home / Self Care | Payer: Managed Care, Other (non HMO) | Source: Ambulatory Visit | Attending: Family Medicine | Admitting: Family Medicine

## 2022-01-09 ENCOUNTER — Other Ambulatory Visit: Payer: Self-pay | Admitting: Family Medicine

## 2022-01-09 DIAGNOSIS — Z872 Personal history of diseases of the skin and subcutaneous tissue: Secondary | ICD-10-CM | POA: Diagnosis present

## 2022-01-09 DIAGNOSIS — N644 Mastodynia: Secondary | ICD-10-CM

## 2022-01-09 DIAGNOSIS — N63 Unspecified lump in unspecified breast: Secondary | ICD-10-CM | POA: Insufficient documentation

## 2022-01-10 ENCOUNTER — Encounter: Payer: Self-pay | Admitting: "Endocrinology

## 2022-01-17 ENCOUNTER — Other Ambulatory Visit: Payer: Self-pay | Admitting: Family Medicine

## 2022-01-17 DIAGNOSIS — N63 Unspecified lump in unspecified breast: Secondary | ICD-10-CM

## 2022-01-17 DIAGNOSIS — R928 Other abnormal and inconclusive findings on diagnostic imaging of breast: Secondary | ICD-10-CM

## 2022-02-14 ENCOUNTER — Other Ambulatory Visit: Payer: Managed Care, Other (non HMO)

## 2022-02-21 ENCOUNTER — Other Ambulatory Visit: Payer: Managed Care, Other (non HMO)

## 2022-05-22 IMAGING — CR DG HIP (WITH OR WITHOUT PELVIS) 2-3V*L*
3 series · 3 of 3 positions shown · non-contrast
Comparison: None.

CLINICAL DATA: Left hip pain.

EXAM:
DG HIP (WITH OR WITHOUT PELVIS) 2-3V LEFT

[pelvis ap]
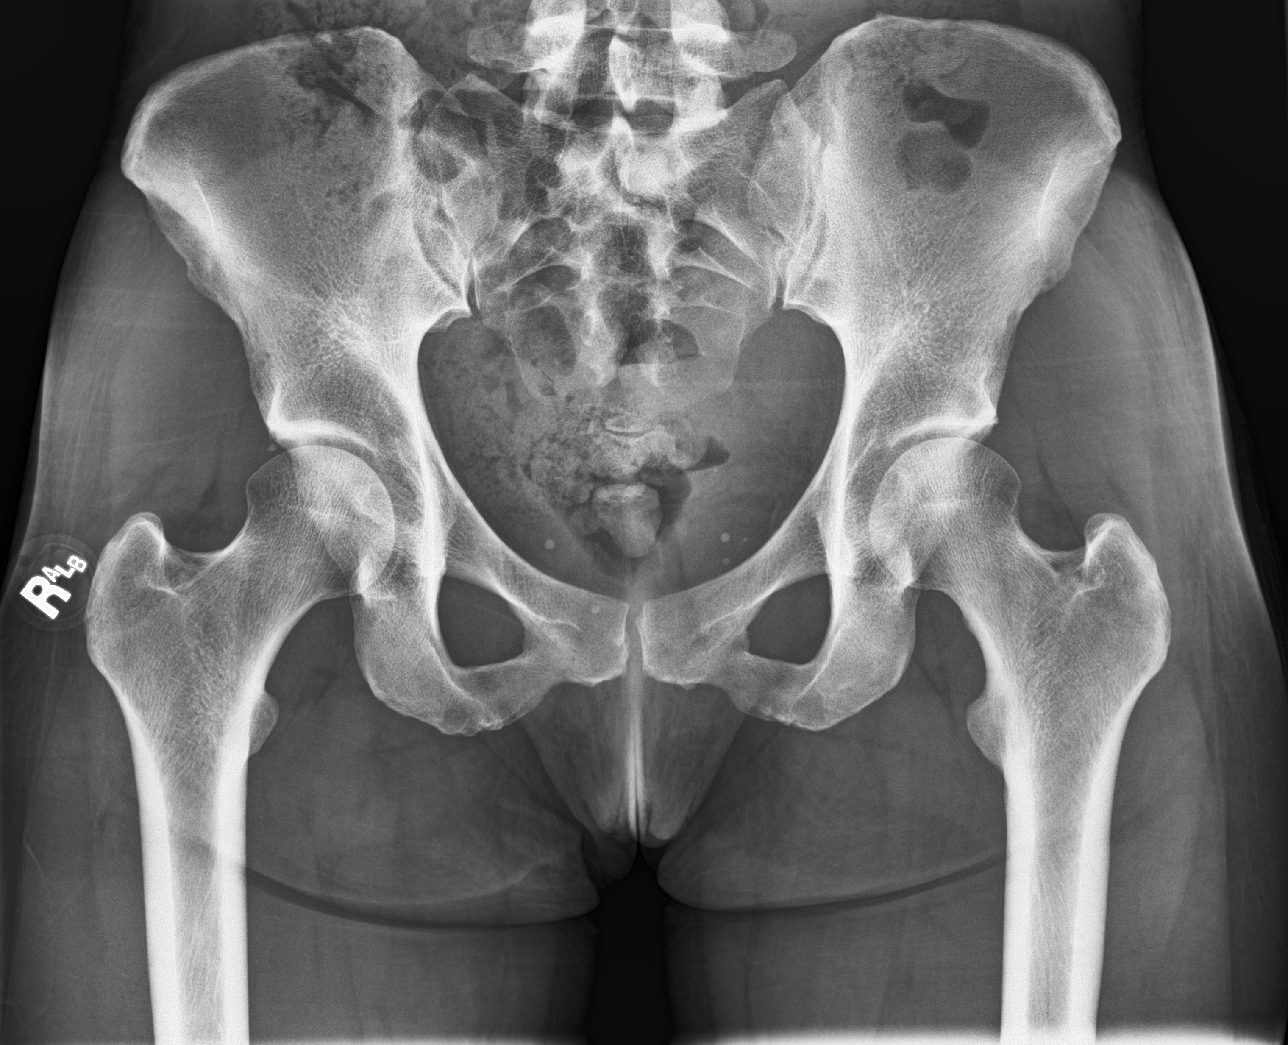

[hip ap]
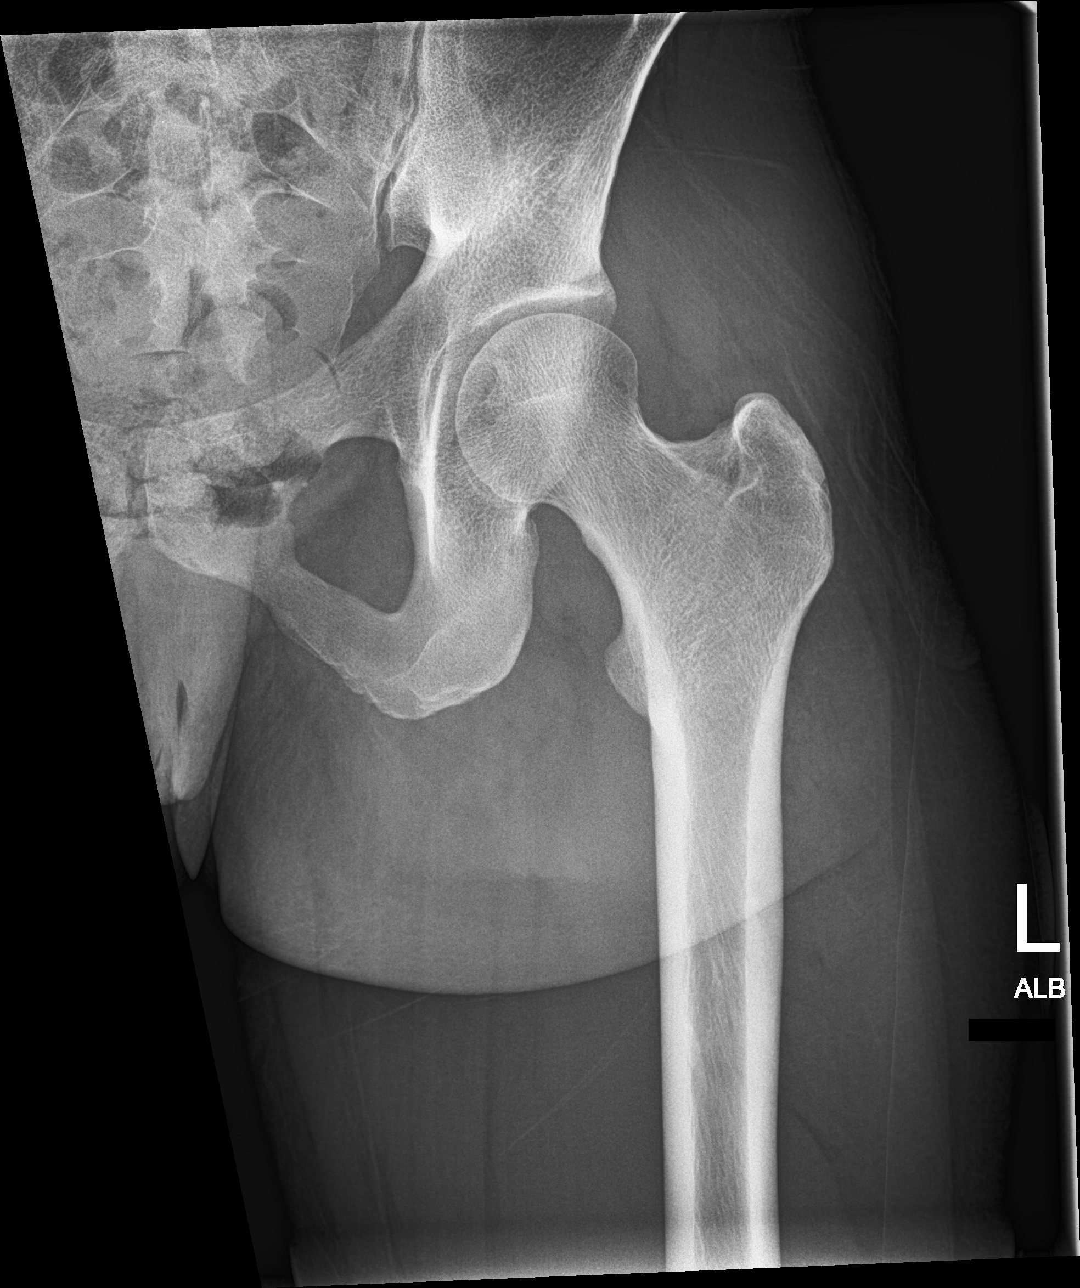

[hip lat]
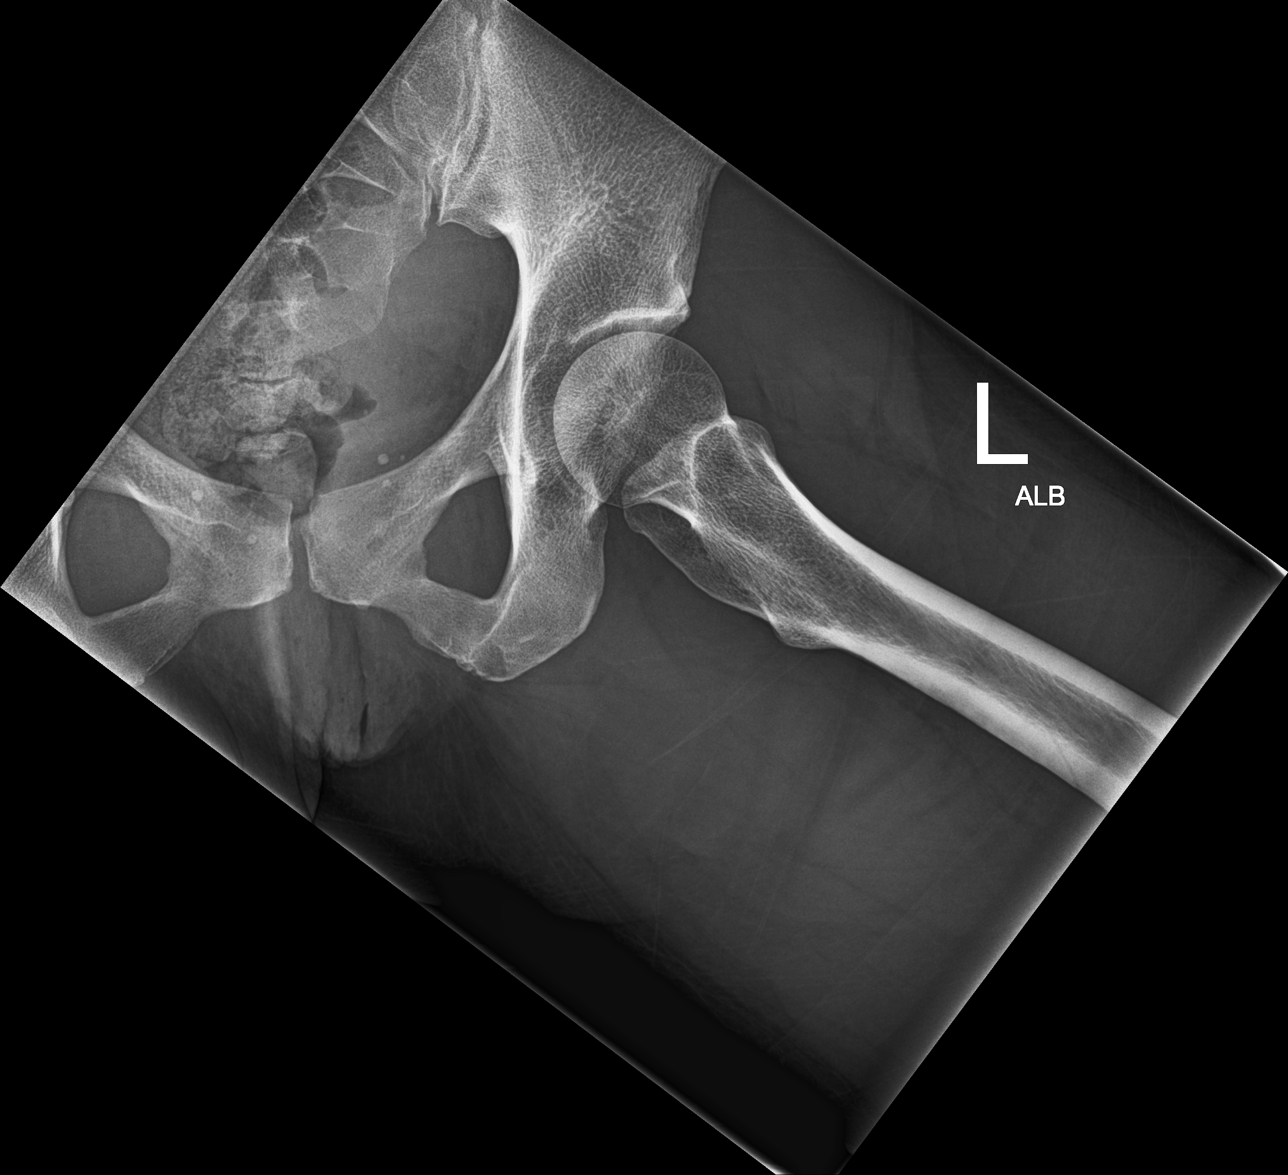

[3 of 3 positions shown; findings below may reference images not displayed]

FINDINGS: There is no evidence of hip fracture or dislocation. There is no
evidence of arthropathy or other focal bone abnormality.
IMPRESSION: Negative.

## 2022-08-17 ENCOUNTER — Other Ambulatory Visit: Payer: Self-pay | Admitting: Family Medicine

## 2022-08-17 DIAGNOSIS — N63 Unspecified lump in unspecified breast: Secondary | ICD-10-CM

## 2022-08-17 DIAGNOSIS — R928 Other abnormal and inconclusive findings on diagnostic imaging of breast: Secondary | ICD-10-CM

## 2022-08-23 ENCOUNTER — Ambulatory Visit
Admission: RE | Admit: 2022-08-23 | Discharge: 2022-08-23 | Disposition: A | Discharge status: Home / Self Care | Payer: Managed Care, Other (non HMO) | Source: Ambulatory Visit | Attending: Family Medicine | Admitting: Family Medicine

## 2022-08-23 DIAGNOSIS — N63 Unspecified lump in unspecified breast: Secondary | ICD-10-CM | POA: Insufficient documentation

## 2022-08-23 DIAGNOSIS — R928 Other abnormal and inconclusive findings on diagnostic imaging of breast: Secondary | ICD-10-CM | POA: Diagnosis present

## 2022-11-01 ENCOUNTER — Encounter: Payer: Self-pay | Admitting: General Surgery

## 2022-11-01 ENCOUNTER — Ambulatory Visit (INDEPENDENT_AMBULATORY_CARE_PROVIDER_SITE_OTHER): Payer: Managed Care, Other (non HMO) | Admitting: General Surgery

## 2022-11-01 ENCOUNTER — Telehealth: Payer: Self-pay | Admitting: General Surgery

## 2022-11-01 VITALS — BP 91/65 | HR 91 | Ht 65.0 in | Wt 128.0 lb

## 2022-11-01 DIAGNOSIS — L0591 Pilonidal cyst without abscess: Secondary | ICD-10-CM | POA: Diagnosis not present

## 2022-11-01 NOTE — Patient Instructions (Signed)
You have requested to have your pilonidal cyst removed. This will be done at [facility] with Dr. Maurine Minister.  You will most likely be out of work 1-2 weeks for this surgery. With dressing changes after surgery.  If you have FMLA or disability paperwork that needs filled out you may drop this off at our office or this can be faxed to [phone].  Please see the (blue)pre-care form that you have been given today. Our surgery scheduler will call you to verify surgery date and to go over information.   If you have any questions, please call our office.  Pilonidal Cyst Removal Pilonidal cyst removal is a procedure to remove a fluid-filled sac (cyst) that forms under the skin near the tailbone, at the top of the crease between the buttocks (pilonidal area). This procedure is also called a pilonidal cystectomy.  Pilonidal cyst is caused by an ingrown hair that irritates the area. Sometimes a tunnel (sinus) forms under the skin from the cyst and makes a second opening in the skin. In that case, the sinus area may also be removed during the procedure. You may need this procedure if you have a cyst that is large, painful, or keeps getting infected. A cyst that becomes infected is called an abscess. The abscess may need to be opened, drained, and treated with antibiotics before the cyst is removed. Tell your health care provider about: Any allergies you have. All medicines you are taking, including vitamins, herbs, eye drops, creams, and over-the-counter medicines. Any problems you or family members have had with anesthetic medicines. Any bleeding problems you have. Any surgeries you have had. Any medical conditions you have. Whether you are pregnant or may be pregnant. Any recent fever, increase in pain, or discharge from the cyst. What are the risks? Your health care provider will talk with you about risks. These may include: Delay in healing. This is the most common  problem. Infection. Bleeding. Allergic reactions to medicines. A closed incision opening. The cyst coming back again (recurrence). What happens before the procedure? Medicines Ask your health care provider about: Changing or stopping your regular medicines. These include any diabetes medicines or blood thinners you take. Taking medicines such as aspirin and ibuprofen. These medicines can thin your blood. Do not take them unless your health care provider tells you to. Taking over-the-counter medicines, vitamins, herbs, and supplements. Surgery safety Ask your health care provider: How your surgery site will be marked. What steps will be taken to help prevent infection. These steps may include: Removing hair at the surgery site. Washing skin with a soap that kills germs. Taking antibiotics. General instructions Do not use any products that contain nicotine or tobacco for at least 4 weeks before the procedure. These products include cigarettes, chewing tobacco, and vaping devices, such as e-cigarettes. If you need help quitting, ask your health care provider. If you will be going home right after the procedure, plan to have a responsible adult: Take you home from the hospital or clinic. You will not be allowed to drive. Care for you for the time you are told. You may need help with wound care and dressing changes. What happens during the procedure?  An IV will be inserted into one of your veins. You may be given: A sedative. This helps you relax. Anesthesia. This will: Numb certain areas of your body. Make you fall asleep for surgery. Your surgeon will make an incision near the cyst. Depending on the size of the cyst  and if the sinus is infected, one of the following will be done: If there is an abscess, a small hole will be made in the cyst. The pus will be drained out. If the sinus is large or keeps getting infected, your surgeon may: Cut out the sinus and remove some of the skin  around it. The wound will be left open to heal on its own. Remove the sinus and cut out a flap on either side of it. The two sides will be stitched together. A thin, flexible tube with a camera (endoscope) may be used before this procedure to better see the area. The surgeon may remove hair and infected tissue. The sinus will then be cleaned with a solution. Heat will be used to seal the sinus. The incision may be left open or closed. An open incision may be packed with gauze and covered with a bandage (dressing). An incision may be closed with stitches (sutures) and covered with a dressing. The area may be sealed with fibrin glue and covered with a dressing. The procedure may vary among health care providers and hospitals. What happens after the procedure? Your blood pressure, heart rate, breathing rate, and blood oxygen level will be monitored until you leave the hospital or clinic. You will be given medicine for pain as needed. If you were given a sedative during the procedure, it can affect you for several hours. Do not drive or operate machinery until your health care provider says that it is safe. Your health care provider will give you instructions for taking care of your dressing at home after the procedure. If your incision was left open and packed with gauze, you will need to change your dressing every day. Summary Pilonidal cyst removal is surgery to remove a fluid-filled sac (cyst) that forms in the crease between the buttocks. The incision used to remove the cyst may be closed with sutures or left open. If left open, it may be packed with gauze and covered with a dressing. You will be given medicine for pain as needed. Your health care provider will give you instructions for taking care of your dressing at home. This information is not intended to replace advice given to you by your health care provider. Make sure you discuss any questions you have with your health care  provider. Document Revised: 03/31/2021 Document Reviewed: 03/31/2021 Elsevier Patient Education  2024 [employer].

## 2022-11-01 NOTE — Telephone Encounter (Signed)
Left message for patient to call, please provide information regarding scheduled surgery with Dr. Maurine Minister.   Pre-Admission date/time, and Surgery date at [facility].  Surgery Date: 11/14/22 Preadmission Testing Date: 11/07/22 (phone 8a-1p)  Also patient will need to call at [phone], between 1-3:00pm the day before surgery, to find out what time to arrive for surgery.

## 2022-11-02 NOTE — Telephone Encounter (Signed)
Patient called back and given surgery information.

## 2022-11-05 ENCOUNTER — Ambulatory Visit: Payer: Self-pay | Admitting: General Surgery

## 2022-11-05 DIAGNOSIS — L0591 Pilonidal cyst without abscess: Secondary | ICD-10-CM

## 2022-11-05 NOTE — Progress Notes (Signed)
Patient ID: [facility], adult   DOB: Dec 25, 1980, 42 y.o.   MRN: [id number]  History of Present Illness [facility] is a 42 y.o. adult with recurrent pilonidal cyst disease that presents for evaluation.  The patient reports that they have not had recurrent abscesses in their gluteal cleft for several years.  They say that at least once a year and sometimes twice a year they will have an abscess form.  At times they have gone to the emergency department for I&D but usually the abscess will burst on its own and drained purulent fluid.  The patient has never had any previous excision of pilonidal cyst disease.  They say that a couple of weeks ago they did have an abscess formed but their wife lanced it and has now drained.  The patient does not have any pain at the current time from the pilonidal cyst disease.  Past Medical History Past Medical History:  Diagnosis Date   Bipolar 1 disorder (HCC)    Seizures (HCC)        Past Surgical History:  Procedure Laterality Date   EYE SURGERY Left    cornea laceration    Allergies  Allergen Reactions   Cortisone Rash   Hydrocortisone Rash    Current Outpatient Medications  Medication Sig Dispense Refill   ALPRAZolam (XANAX) 0.25 MG tablet Take 0.25 mg by mouth daily as needed for anxiety.     Multiple Vitamin (MULTIVITAMIN WITH MINERALS) TABS tablet Take 1 tablet by mouth daily.     sertraline (ZOLOFT) 50 MG tablet Take 50 mg by mouth daily.  0   valproic acid (DEPAKENE) 250 MG capsule Take 500 mg by mouth 2 (two) times daily.  1   Ferrous Sulfate (IRON PO) Take 1 tablet by mouth daily.     loratadine (CLARITIN) 10 MG tablet Take 10 mg by mouth daily.     VITAMIN D PO Take 1 capsule by mouth daily.     No current facility-administered medications for this visit.    Family History Family History  Problem Relation Age of Onset   Hypertension Mother    Thyroid disease Mother    Heart disease Mother    Other Father        unknown  medical history   Breast cancer Paternal Aunt    Breast cancer Paternal Aunt    Breast cancer Paternal Aunt        Social History Social History   Tobacco Use   Smoking status: Every Day    Current packs/day: 0.50    Types: Cigarettes    Passive exposure: Past   Smokeless tobacco: Never  Vaping Use   Vaping status: Never Used  Substance Use Topics   Alcohol use: Yes    Alcohol/week: 0.0 standard drinks of alcohol    Comment: occasionally   Drug use: No        ROS Full ROS of systems performed and is otherwise negative there than what is stated in the HPI  Physical Exam Blood pressure 91/65, pulse 91, height 5\' 5"  (1.651 m), weight 128 lb (58.1 kg), last menstrual period 09/26/2022.  No acute distress, alert and oriented, PERRLA, clear to auscultation bilaterally, no murmurs rubs gallops Moving all extremities spontaneously A buttock skin exam was performed in the presence of a chaperone.  At the top of her gluteal cleft there is a closed wound that was the site of her previous abscess.  There is no fluctuance or evidence of  infection now.  Within her gluteal cleft there are several small pits consistent with pilonidal cyst disease none of these have active infection.  I have personally reviewed the patient's imaging and medical records.    Assessment    Patient is a 42 year old with recurrent pilonidal cyst disease.  He has had history of abscesses but there is no current abscess on exam.  Plan    I discussed with the patient that I would recommend a pilonidal cyst excision.  I also discussed that I perform these initially minimally invasive with a gipps procedure.  I discussed the risk, benefits alternatives of this procedure including risk of infection, bleeding and recurrence of the pilonidal cyst.  I also talked with him about the fact that if this does not work then he would need a wider excision.  He understands these risks and wishes to proceed with  surgery   Kandis Cocking 11/05/2022, 11:50 AM

## 2022-11-07 ENCOUNTER — Encounter
Admission: RE | Admit: 2022-11-07 | Discharge: 2022-11-07 | Disposition: A | Discharge status: Home / Self Care | Payer: Managed Care, Other (non HMO) | Source: Ambulatory Visit | Attending: General Surgery | Admitting: General Surgery

## 2022-11-07 DIAGNOSIS — Z01818 Encounter for other preprocedural examination: Secondary | ICD-10-CM

## 2022-11-07 HISTORY — DX: Pilonidal cyst without abscess: L05.91

## 2022-11-07 HISTORY — DX: Tobacco use: Z72.0

## 2022-11-07 NOTE — Patient Instructions (Signed)
Your procedure is scheduled on:11-14-22 Wednesday Report to the Registration Desk on the 1st floor of the [facility].Then proceed to the 2nd floor Surgery Desk To find out your arrival time, please call [phone] between 1PM - 3PM on:11-13-22 Tuesday If your arrival time is 6:00 am, do not arrive before that time as the [facility] entrance doors do not open until 6:00 am.  REMEMBER: Instructions that are not followed completely may result in serious medical risk, up to and including death; or upon the discretion of your surgeon and anesthesiologist your surgery may need to be rescheduled.  Do not eat food OR drink any liquids after midnight the night before surgery.  No gum chewing or hard candies.  One week prior to surgery: Stop Anti-inflammatories (NSAIDS) such as Advil, Aleve, Ibuprofen, Motrin, Naproxen, Naprosyn and Aspirin based products such as Excedrin, Goody's Powder, BC Powder. Stop ANY OVER THE COUNTER supplements/vitamins NOW (11-07-22) until after surgery (Iron, Multivitamin, Vitamin D)  You may however, continue to take Tylenol if needed for pain up until the day of surgery  Continue taking all of your other prescription medications up until the day of surgery.  ON THE DAY OF SURGERY ONLY TAKE THESE MEDICATIONS WITH SIPS OF WATER: -sertraline (ZOLOFT)  -valproic acid (DEPAKENE)  -You may take ALPRAZolam Prudy Feeler) if needed for anxiety  No Alcohol for 24 hours before or after surgery.  No Smoking including e-cigarettes for 24 hours before surgery.  No chewable tobacco products for at least 6 hours before surgery.  No nicotine patches on the day of surgery.  Do not use any "recreational" drugs for at least a week (preferably 2 weeks) before your surgery.  Please be advised that the combination of cocaine and anesthesia may have negative outcomes, up to and including death. If you test positive for cocaine, your surgery will be cancelled.  On the morning of surgery  brush your teeth with toothpaste and water, you may rinse your mouth with mouthwash if you wish. Do not swallow any toothpaste or mouthwash.  Use CHG Soap as directed on instruction sheet.  Do not wear jewelry, make-up, hairpins, clips or nail polish.  For welded (permanent) jewelry: bracelets, anklets, waist bands, etc.  Please have this removed prior to surgery.  If it is not removed, there is a chance that hospital personnel will need to cut it off on the day of surgery.  Do not wear lotions, powders, or perfumes.   Do not shave body hair from the neck down 48 hours before surgery.  Contact lenses, hearing aids and dentures may not be worn into surgery.  Do not bring valuables to the hospital. [facility] is not responsible for any missing/lost belongings or valuables.   Notify your doctor if there is any change in your medical condition (cold, fever, infection).  Wear comfortable clothing (specific to your surgery type) to the hospital.  After surgery, you can help prevent lung complications by doing breathing exercises.  Take deep breaths and cough every 1-2 hours. Your doctor may order a device called an Incentive Spirometer to help you take deep breaths. When coughing or sneezing, hold a pillow firmly against your incision with both hands. This is called "splinting." Doing this helps protect your incision. It also decreases belly discomfort.  If you are being admitted to the hospital overnight, leave your suitcase in the car. After surgery it may be brought to your room.  In case of increased patient census, it may be necessary  for you, the patient, to continue your postoperative care in the Same Day Surgery department.  If you are being discharged the day of surgery, you will not be allowed to drive home. You will need a responsible individual to drive you home and stay with you for 24 hours after surgery.   If you are taking public transportation, you will need to have a  responsible individual with you.  Please call the Pre-admissions Testing Dept. at [phone] if you have any questions about these instructions.  Surgery Visitation Policy:  Patients having surgery or a procedure may have two visitors.  Children under the age of 48 must have an adult with them who is not the patient.     Preparing for Surgery with CHLORHEXIDINE GLUCONATE (CHG) Soap  Chlorhexidine Gluconate (CHG) Soap  o An antiseptic cleaner that kills germs and bonds with the skin to continue killing germs even after washing  o Used for showering the night before surgery and morning of surgery  Before surgery, you can play an important role by reducing the number of germs on your skin.  CHG (Chlorhexidine gluconate) soap is an antiseptic cleanser which kills germs and bonds with the skin to continue killing germs even after washing.  Please do not use if you have an allergy to CHG or antibacterial soaps. If your skin becomes reddened/irritated stop using the CHG.  1. Shower the NIGHT BEFORE SURGERY and the MORNING OF SURGERY with CHG soap.  2. If you choose to wash your hair, wash your hair first as usual with your normal shampoo.  3. After shampooing, rinse your hair and body thoroughly to remove the shampoo.  4. Use CHG as you would any other liquid soap. You can apply CHG directly to the skin and wash gently with a scrungie or a clean washcloth.  5. Apply the CHG soap to your body only from the neck down. Do not use on open wounds or open sores. Avoid contact with your eyes, ears, mouth, and genitals (private parts). Wash face and genitals (private parts) with your normal soap.  6. Wash thoroughly, paying special attention to the area where your surgery will be performed.  7. Thoroughly rinse your body with warm water.  8. Do not shower/wash with your normal soap after using and rinsing off the CHG soap.  9. Pat yourself dry with a clean towel.  10. Wear clean  pajamas to bed the night before surgery.  12. Place clean sheets on your bed the night of your first shower and do not sleep with pets.  13. Shower again with the CHG soap on the day of surgery prior to arriving at the hospital.  14. Do not apply any deodorants/lotions/powders.  15. Please wear clean clothes to the hospital.

## 2022-11-07 NOTE — Pre-Procedure Instructions (Signed)
Called pt to do her anesthesia interview for upcoming surgery with Dr Maurine Minister. In Epic it states that pt is transgender female (born female but identifies as transgender female) Making sure everything was correct in Epic, I informed pt that in Epic it states that she was born female but identifies as transgender female. Pt became very offended and was mad stating "who said that I was transgender female?" Pt assuming that it must have been [facility] that put that in Epic and pt was furious that no one asked this in the surgery office and just assumed that this is how she identified. Pt states that she identifies as female and that she is a lesbian and is married to Ewing. Pt states that she does not know if she is still going to pursue surgery with this office. Pt states she will talk with her wife and make a decision from that. Pt states that she will go ahead with anesthesia interview. I removed the female part from epic and updated the sexual orientation part adding that pt is lesbian, identifies as she/her and still has all her female parts

## 2022-11-13 MED ORDER — CHLORHEXIDINE GLUCONATE 0.12 % MT SOLN
15.0000 mL | Freq: Once | OROMUCOSAL | Status: AC
  Administered 2022-11-14: 15 mL via OROMUCOSAL

## 2022-11-13 MED ORDER — CHLORHEXIDINE GLUCONATE CLOTH 2 % EX PADS
6.0000 | MEDICATED_PAD | Freq: Once | CUTANEOUS | Status: AC
  Administered 2022-11-14: 6 via TOPICAL

## 2022-11-13 MED ORDER — CHLORHEXIDINE GLUCONATE CLOTH 2 % EX PADS: 6.0000 | MEDICATED_PAD | Freq: Once | CUTANEOUS | Status: DC

## 2022-11-13 MED ORDER — ORAL CARE MOUTH RINSE: 15.0000 mL | Freq: Once | OROMUCOSAL | Status: AC

## 2022-11-13 MED ORDER — LACTATED RINGERS IV SOLN: INTRAVENOUS | Status: DC

## 2022-11-13 MED ORDER — CEFAZOLIN SODIUM-DEXTROSE 2-4 GM/100ML-% IV SOLN
2.0000 g | INTRAVENOUS | Status: AC
  Administered 2022-11-14: 2 g via INTRAVENOUS

## 2022-11-14 ENCOUNTER — Other Ambulatory Visit: Payer: Self-pay

## 2022-11-14 ENCOUNTER — Ambulatory Visit: Payer: Managed Care, Other (non HMO) | Admitting: Registered Nurse

## 2022-11-14 ENCOUNTER — Ambulatory Visit
Admission: RE | Admit: 2022-11-14 | Discharge: 2022-11-14 | Disposition: A | Discharge status: Home / Self Care | Payer: Managed Care, Other (non HMO) | Attending: General Surgery | Admitting: General Surgery

## 2022-11-14 ENCOUNTER — Encounter
Admission: RE | Disposition: A | Discharge status: Home / Self Care | Payer: Self-pay | Source: Home / Self Care | Attending: General Surgery

## 2022-11-14 ENCOUNTER — Encounter: Payer: Self-pay | Admitting: General Surgery

## 2022-11-14 ENCOUNTER — Ambulatory Visit: Payer: Managed Care, Other (non HMO) | Admitting: Urgent Care

## 2022-11-14 DIAGNOSIS — F319 Bipolar disorder, unspecified: Secondary | ICD-10-CM | POA: Diagnosis not present

## 2022-11-14 DIAGNOSIS — Z01818 Encounter for other preprocedural examination: Secondary | ICD-10-CM

## 2022-11-14 DIAGNOSIS — L0591 Pilonidal cyst without abscess: Secondary | ICD-10-CM

## 2022-11-14 DIAGNOSIS — F17210 Nicotine dependence, cigarettes, uncomplicated: Secondary | ICD-10-CM | POA: Insufficient documentation

## 2022-11-14 HISTORY — PX: PILONIDAL CYST EXCISION: SHX744

## 2022-11-14 LAB — POCT PREGNANCY, URINE: Preg Test, Ur: NEGATIVE

## 2022-11-14 SURGERY — EXCISION, PILONIDAL CYST, EXTENSIVE
Anesthesia: General

## 2022-11-14 MED ORDER — 0.9 % SODIUM CHLORIDE (POUR BTL) OPTIME
TOPICAL | Status: DC | PRN
  Administered 2022-11-14: 500 mL

## 2022-11-14 MED ORDER — BUPIVACAINE LIPOSOME 1.3 % IJ SUSP
INTRAMUSCULAR | Status: AC
  Filled 2022-11-14: qty 10

## 2022-11-14 MED ORDER — OXYCODONE HCL 5 MG PO TABS
5.0000 mg | ORAL_TABLET | ORAL | Status: DC | PRN
  Administered 2022-11-14: 5 mg via ORAL

## 2022-11-14 MED ORDER — LIDOCAINE HCL (CARDIAC) PF 100 MG/5ML IV SOSY
PREFILLED_SYRINGE | INTRAVENOUS | Status: DC | PRN
  Administered 2022-11-14: 60 mg via INTRAVENOUS

## 2022-11-14 MED ORDER — ROCURONIUM BROMIDE 100 MG/10ML IV SOLN
INTRAVENOUS | Status: DC | PRN
  Administered 2022-11-14: 40 mg via INTRAVENOUS

## 2022-11-14 MED ORDER — PROPOFOL 10 MG/ML IV BOLUS
INTRAVENOUS | Status: DC | PRN
  Administered 2022-11-14: 150 mg via INTRAVENOUS

## 2022-11-14 MED ORDER — FENTANYL CITRATE (PF) 100 MCG/2ML IJ SOLN: 25.0000 ug | INTRAMUSCULAR | Status: DC | PRN

## 2022-11-14 MED ORDER — MIDAZOLAM HCL 2 MG/2ML IJ SOLN
INTRAMUSCULAR | Status: AC
  Filled 2022-11-14: qty 2

## 2022-11-14 MED ORDER — DEXMEDETOMIDINE HCL IN NACL 80 MCG/20ML IV SOLN
INTRAVENOUS | Status: DC | PRN
  Administered 2022-11-14: 8 ug via INTRAVENOUS

## 2022-11-14 MED ORDER — SUGAMMADEX SODIUM 200 MG/2ML IV SOLN
INTRAVENOUS | Status: DC | PRN
  Administered 2022-11-14: 200 mg via INTRAVENOUS

## 2022-11-14 MED ORDER — BUPIVACAINE-EPINEPHRINE (PF) 0.5% -1:200000 IJ SOLN
INTRAMUSCULAR | Status: AC
  Filled 2022-11-14: qty 10

## 2022-11-14 MED ORDER — ACETAMINOPHEN 500 MG PO TABS
1000.0000 mg | ORAL_TABLET | Freq: Once | ORAL | Status: AC
  Administered 2022-11-14: 1000 mg via ORAL

## 2022-11-14 MED ORDER — MIDAZOLAM HCL 2 MG/2ML IJ SOLN
INTRAMUSCULAR | Status: DC | PRN
  Administered 2022-11-14: 2 mg via INTRAVENOUS

## 2022-11-14 MED ORDER — FENTANYL CITRATE (PF) 100 MCG/2ML IJ SOLN
INTRAMUSCULAR | Status: AC
  Filled 2022-11-14: qty 2

## 2022-11-14 MED ORDER — ONDANSETRON HCL 4 MG/2ML IJ SOLN
INTRAMUSCULAR | Status: DC | PRN
  Administered 2022-11-14: 4 mg via INTRAVENOUS

## 2022-11-14 MED ORDER — OXYCODONE HCL 5 MG PO TABS
ORAL_TABLET | ORAL | Status: AC
  Filled 2022-11-14: qty 1

## 2022-11-14 MED ORDER — ACETAMINOPHEN 500 MG PO TABS: 500.0000 mg | ORAL_TABLET | Freq: Once | ORAL | Status: DC

## 2022-11-14 MED ORDER — DROPERIDOL 2.5 MG/ML IJ SOLN: 0.6250 mg | Freq: Once | INTRAMUSCULAR | Status: DC | PRN

## 2022-11-14 MED ORDER — FENTANYL CITRATE (PF) 100 MCG/2ML IJ SOLN
INTRAMUSCULAR | Status: DC | PRN
  Administered 2022-11-14 (×2): 50 ug via INTRAVENOUS

## 2022-11-14 MED ORDER — BUPIVACAINE-EPINEPHRINE 0.5% -1:200000 IJ SOLN
INTRAMUSCULAR | Status: DC | PRN
  Administered 2022-11-14: 10 mL

## 2022-11-14 MED ORDER — ACETAMINOPHEN 500 MG PO TABS
ORAL_TABLET | ORAL | Status: AC
  Filled 2022-11-14: qty 2

## 2022-11-14 MED ORDER — CHLORHEXIDINE GLUCONATE 0.12 % MT SOLN
OROMUCOSAL | Status: AC
  Filled 2022-11-14: qty 15

## 2022-11-14 MED ORDER — CEFAZOLIN SODIUM-DEXTROSE 2-4 GM/100ML-% IV SOLN
INTRAVENOUS | Status: AC
  Filled 2022-11-14: qty 100

## 2022-11-14 SURGICAL SUPPLY — 33 items
BLADE CLIPPER SURG (BLADE) IMPLANT
BLADE SURG 15 STRL LF DISP TIS (BLADE) ×1 IMPLANT
BLADE SURG 15 STRL SS (BLADE) ×1
BRIEF MESH DISP 2XL (UNDERPADS AND DIAPERS) ×1 IMPLANT
DRAPE LAPAROTOMY 100X77 ABD (DRAPES) ×1 IMPLANT
DRSG GAUZE FLUFF 36X18 (GAUZE/BANDAGES/DRESSINGS) ×1 IMPLANT
ELECT REM PT RETURN 9FT ADLT (ELECTROSURGICAL) ×1
ELECTRODE REM PT RTRN 9FT ADLT (ELECTROSURGICAL) ×1 IMPLANT
GAUZE 4X4 16PLY ~~LOC~~+RFID DBL (SPONGE) IMPLANT
GAUZE SPONGE 4X4 12PLY STRL (GAUZE/BANDAGES/DRESSINGS) ×1 IMPLANT
GLOVE BIOGEL PI IND STRL 7.5 (GLOVE) ×1 IMPLANT
GLOVE SURG SYN 7.0 (GLOVE) ×1
GLOVE SURG SYN 7.0 PF PI (GLOVE) ×1 IMPLANT
GOWN STRL REUS W/ TWL LRG LVL3 (GOWN DISPOSABLE) ×2 IMPLANT
GOWN STRL REUS W/TWL LRG LVL3 (GOWN DISPOSABLE) ×2
IV CATH ANGIO 14GX3.25 ORG (MISCELLANEOUS) IMPLANT
MANIFOLD NEPTUNE II (INSTRUMENTS) ×1 IMPLANT
NDL HYPO 22X1.5 SAFETY MO (MISCELLANEOUS) ×1 IMPLANT
NEEDLE HYPO 22X1.5 SAFETY MO (MISCELLANEOUS) ×1
NS IRRIG 500ML POUR BTL (IV SOLUTION) ×1 IMPLANT
PACK BASIN MINOR ARMC (MISCELLANEOUS) ×1 IMPLANT
PUNCH BIOPSY 4MM (MISCELLANEOUS) ×1
PUNCH BIOPSY DISP 4 (MISCELLANEOUS) IMPLANT
SOL PREP PVP 2OZ (MISCELLANEOUS) ×1
SOLUTION PREP PVP 2OZ (MISCELLANEOUS) ×1 IMPLANT
SUT ETHILON 2 0 FS 18 (SUTURE) IMPLANT
SUT VIC AB 2-0 CT1 (SUTURE) IMPLANT
SUT VIC AB 2-0 CT1 27 (SUTURE)
SUT VIC AB 2-0 CT1 TAPERPNT 27 (SUTURE) IMPLANT
SYR 10ML LL (SYRINGE) ×1 IMPLANT
SYR BULB IRRIG 60ML STRL (SYRINGE) ×1 IMPLANT
TRAP FLUID SMOKE EVACUATOR (MISCELLANEOUS) ×1 IMPLANT
WATER STERILE IRR 500ML POUR (IV SOLUTION) ×1 IMPLANT

## 2022-11-14 NOTE — Brief Op Note (Signed)
11/14/2022  11:43 AM  PATIENT:  [facility]  42 y.o. female  PRE-OPERATIVE DIAGNOSIS:  pilonidal cyst  POST-OPERATIVE DIAGNOSIS:  pilonidal cyst  PROCEDURE:  Procedure(s): CYST EXCISION PILONIDAL EXTENSIVE (N/A)  SURGEON:  Surgeons and Role:    * Kandis Cocking, MD - Primary  PHYSICIAN ASSISTANT:   ASSISTANTS: none   ANESTHESIA:   general  EBL:  5ccs   BLOOD ADMINISTERED:none  DRAINS: none   LOCAL MEDICATIONS USED:  MARCAINE     SPECIMEN:  No Specimen  DISPOSITION OF SPECIMEN:  N/A  COUNTS:  YES  TOURNIQUET:  * No tourniquets in log *  DICTATION: .Dragon Dictation  PLAN OF CARE: Discharge to home after PACU  PATIENT DISPOSITION:  PACU - hemodynamically stable.   Delay start of Pharmacological VTE agent (>24hrs) due to surgical blood loss or risk of bleeding: no

## 2022-11-14 NOTE — H&P (Signed)
No changes to H and P, proceed as planned    Patient ID: [facility], adult   DOB: 12-25-1980, 42 y.o.   MRN: [id number] History of Present Illness [facility] is a 42 y.o. adult with recurrent pilonidal cyst disease that presents for evaluation.  The patient reports that they have not had recurrent abscesses in their gluteal cleft for several years.  They say that at least once a year and sometimes twice a year they will have an abscess form.  At times they have gone to the emergency department for I&D but usually the abscess will burst on its own and drained purulent fluid.  The patient has never had any previous excision of pilonidal cyst disease.  They say that a couple of weeks ago they did have an abscess formed but their wife lanced it and has now drained.  The patient does not have any pain at the current time from the pilonidal cyst disease.   Past Medical History     Past Medical History:  Diagnosis Date   Bipolar 1 disorder (HCC)     Seizures (HCC)                   Past Surgical History:  Procedure Laterality Date   EYE SURGERY Left      cornea laceration          Allergies      Allergies  Allergen Reactions   Cortisone Rash   Hydrocortisone Rash              Current Outpatient Medications  Medication Sig Dispense Refill   ALPRAZolam (XANAX) 0.25 MG tablet Take 0.25 mg by mouth daily as needed for anxiety.       Multiple Vitamin (MULTIVITAMIN WITH MINERALS) TABS tablet Take 1 tablet by mouth daily.       sertraline (ZOLOFT) 50 MG tablet Take 50 mg by mouth daily.   0   valproic acid (DEPAKENE) 250 MG capsule Take 500 mg by mouth 2 (two) times daily.   1   Ferrous Sulfate (IRON PO) Take 1 tablet by mouth daily.       loratadine (CLARITIN) 10 MG tablet Take 10 mg by mouth daily.       VITAMIN D PO Take 1 capsule by mouth daily.          No current facility-administered medications for this visit.        Family History      Family History  Problem  Relation Age of Onset   Hypertension Mother     Thyroid disease Mother     Heart disease Mother     Other Father          unknown medical history   Breast cancer Paternal Aunt     Breast cancer Paternal Aunt     Breast cancer Paternal Aunt              Social History Social History  Social History         Tobacco Use   Smoking status: Every Day      Current packs/day: 0.50      Types: Cigarettes      Passive exposure: Past   Smokeless tobacco: Never  Vaping Use   Vaping status: Never Used  Substance Use Topics   Alcohol use: Yes      Alcohol/week: 0.0 standard drinks of alcohol      Comment: occasionally   Drug use: No  ROS Full ROS of systems performed and is otherwise negative there than what is stated in the HPI   Physical Exam Blood pressure 91/65, pulse 91, height 5\' 5"  (1.651 m), weight 128 lb (58.1 kg), last menstrual period 09/26/2022.   No acute distress, alert and oriented, PERRLA, clear to auscultation bilaterally, no murmurs rubs gallops Moving all extremities spontaneously A buttock skin exam was performed in the presence of a chaperone.  At the top of her gluteal cleft there is a closed wound that was the site of her previous abscess.  There is no fluctuance or evidence of infection now.  Within her gluteal cleft there are several small pits consistent with pilonidal cyst disease none of these have active infection.   I have personally reviewed the patient's imaging and medical records.     Assessment   Assessment Patient is a 42 year old with recurrent pilonidal cyst disease.  He has had history of abscesses but there is no current abscess on exam.   Plan     Plan I discussed with the patient that I would recommend a pilonidal cyst excision.  I also discussed that I perform these initially minimally invasive with a gipps procedure.  I discussed the risk, benefits alternatives of this procedure including risk of infection, bleeding and  recurrence of the pilonidal cyst.  I also talked with him about the fact that if this does not work then he would need a wider excision.  He understands these risks and wishes to proceed with surgery

## 2022-11-14 NOTE — Anesthesia Preprocedure Evaluation (Signed)
Anesthesia Evaluation  Patient identified by MRN, date of birth, ID band Patient awake    Reviewed: Allergy & Precautions, H&P , NPO status , Patient's Chart, lab work & pertinent test results, reviewed documented beta blocker date and time   History of Anesthesia Complications Negative for: history of anesthetic complications  Airway Mallampati: II  TM Distance: >3 FB Neck ROM: full    Dental  (+) Dental Advidsory Given, Teeth Intact   Pulmonary neg shortness of breath, Continuous Positive Airway Pressure Ventilation , neg COPD, neg recent URI, Current Smoker and Patient abstained from smoking.   Pulmonary exam normal breath sounds clear to auscultation       Cardiovascular Exercise Tolerance: Good negative cardio ROS Normal cardiovascular exam Rhythm:regular Rate:Normal     Neuro/Psych Seizures - (as a child), Well Controlled,  PSYCHIATRIC DISORDERS   Bipolar Disorder      GI/Hepatic negative GI ROS, Neg liver ROS,,,  Endo/Other  negative endocrine ROS    Renal/GU negative Renal ROS  negative genitourinary   Musculoskeletal   Abdominal   Peds  Hematology negative hematology ROS (+)   Anesthesia Other Findings Past Medical History: No date: Bipolar 1 disorder (HCC) No date: Pilonidal cyst No date: Seizures (HCC)     Comment:  last one was during teenage years No date: Tobacco use   Reproductive/Obstetrics negative OB ROS                             Anesthesia Physical Anesthesia Plan  ASA: 2  Anesthesia Plan: General   Post-op Pain Management:    Induction: Intravenous  PONV Risk Score and Plan: 2 and Ondansetron, Dexamethasone, Midazolam and Treatment may vary due to age or medical condition  Airway Management Planned: Oral ETT  Additional Equipment:   Intra-op Plan:   Post-operative Plan: Extubation in OR  Informed Consent: I have reviewed the patients History and  Physical, chart, labs and discussed the procedure including the risks, benefits and alternatives for the proposed anesthesia with the patient or authorized representative who has indicated his/her understanding and acceptance.     Dental Advisory Given  Plan Discussed with: Anesthesiologist, CRNA and Surgeon  Anesthesia Plan Comments:        Anesthesia Quick Evaluation

## 2022-11-14 NOTE — Anesthesia Procedure Notes (Signed)
Procedure Name: Intubation Date/Time: 11/14/2022 10:58 AM  Performed by: Karoline Caldwell, CRNAPre-anesthesia Checklist: Patient identified, Patient being monitored, Timeout performed, Emergency Drugs available and Suction available Patient Re-evaluated:Patient Re-evaluated prior to induction Oxygen Delivery Method: Circle system utilized Preoxygenation: Pre-oxygenation with 100% oxygen Induction Type: IV induction Ventilation: Mask ventilation without difficulty Laryngoscope Size: 3 and McGraph Grade View: Grade I Tube type: Oral Tube size: 7.0 mm Number of attempts: 1 Airway Equipment and Method: Stylet Placement Confirmation: ETT inserted through vocal cords under direct vision, positive ETCO2 and breath sounds checked- equal and bilateral Secured at: 21 cm Tube secured with: Tape Dental Injury: Teeth and Oropharynx as per pre-operative assessment

## 2022-11-14 NOTE — Transfer of Care (Signed)
Immediate Anesthesia Transfer of Care Note  Patient: Joanne Tate  Procedure(s) Performed: CYST EXCISION PILONIDAL EXTENSIVE  Patient Location: PACU  Anesthesia Type:General  Level of Consciousness: awake, alert , and oriented  Airway & Oxygen Therapy: Patient Spontanous Breathing  Post-op Assessment: Report given to RN and Post -op Vital signs reviewed and stable  Post vital signs: Reviewed and stable  Last Vitals:  Vitals Value Taken Time  BP 95/70 11/14/22 1145  Temp 36.2 C 11/14/22 1145  Pulse 78 11/14/22 1149  Resp 17 11/14/22 1149  SpO2 100 % 11/14/22 1149  Vitals shown include unfiled device data.  Last Pain:  Vitals:   11/14/22 0950  TempSrc: Temporal  PainSc: 0-No pain         Complications: No notable events documented.

## 2022-11-14 NOTE — Progress Notes (Signed)
Patient sent home with sitz bath for home usage

## 2022-11-16 DIAGNOSIS — L0591 Pilonidal cyst without abscess: Secondary | ICD-10-CM

## 2022-11-16 NOTE — Anesthesia Postprocedure Evaluation (Signed)
Anesthesia Post Note  Patient: Joanne Tate  Procedure(s) Performed: CYST EXCISION PILONIDAL EXTENSIVE  Patient location during evaluation: PACU Anesthesia Type: General Level of consciousness: awake and alert Pain management: pain level controlled Vital Signs Assessment: post-procedure vital signs reviewed and stable Respiratory status: spontaneous breathing, nonlabored ventilation, respiratory function stable and patient connected to nasal cannula oxygen Cardiovascular status: blood pressure returned to baseline and stable Postop Assessment: no apparent nausea or vomiting Anesthetic complications: no   No notable events documented.   Last Vitals:  Vitals:   11/14/22 1200 11/14/22 1215  BP: 95/66 115/86  Pulse: 76 62  Resp: 13 11  Temp:  (!) 36.1 C  SpO2: 100% 99%    Last Pain:  Vitals:   11/14/22 1215  TempSrc:   PainSc: 4                  Lenard Simmer

## 2022-11-16 NOTE — Op Note (Signed)
Operative Note  Pre-Op Diagnosis: Pilonidal cyst disease Postop diagnosis: Same Procedure: Excision of pilonidal cyst, gipps procedure Surgeon: Baker Pierini, MD Assistants: None EBL:3ccs   After informed consent was obtained the patient was brought to the operating room and intubated on the hospital gurney.  She was then placed in the prone position.  The gluteal cleft and buttocks were then prepped and draped in the usual sterile fashion.  A surgical timeout was called identifying correct patient, site, side and procedure.  The patient had a previous abscess and 3 pilonidal cyst pits.  Using a 4 mm punch biopsy the first pit was excised.  A curette was used to clear the underlying tissue of debris with return of hair and granulation tissue.  This was connected with the site of the previous abscess.  The second and third pit were then also excised with a 4 mm punch biopsy and the underlying tract cleared of any debris with a curette.  Hemostasis.  A piece of gauze was placed between the excised pilonidal cyst cavity and the previous abscess cavity.  The wound was left open to heal.  Prior to termination of the procedure all sponge and instrument counts were correct x 2.  The patient was then flipped back supine and extubated.  She was transferred to the PACU in good condition

## 2022-11-29 ENCOUNTER — Encounter: Payer: Managed Care, Other (non HMO) | Admitting: General Surgery

## 2022-12-13 ENCOUNTER — Encounter: Payer: Self-pay | Admitting: General Surgery

## 2022-12-13 ENCOUNTER — Ambulatory Visit: Payer: Managed Care, Other (non HMO) | Admitting: General Surgery

## 2022-12-13 VITALS — BP 95/62 | HR 85 | Temp 98.4°F | Ht 65.0 in | Wt 129.4 lb

## 2022-12-13 DIAGNOSIS — L0591 Pilonidal cyst without abscess: Secondary | ICD-10-CM

## 2022-12-13 DIAGNOSIS — Z09 Encounter for follow-up examination after completed treatment for conditions other than malignant neoplasm: Secondary | ICD-10-CM

## 2022-12-13 NOTE — Patient Instructions (Signed)
Pilonidal Cyst Removal, Care After The following information offers guidance on how to care for yourself after your procedure. Your health care provider may also give you more specific instructions. If you have problems or questions, contact your health care provider. What can I expect after the procedure? After the procedure, it is common to have: Pain. Redness. Some swelling. Some fluid or blood coming from your incision (drainage). You may have more drainage if you have an open incision. Follow these instructions at home: Medicines Take over-the-counter and prescription medicines only as told by your health care provider. If you were prescribed antibiotics, take them as told by your health care provider. Do not stop using the antibiotic even if you start to feel better. Ask your health care provider if the medicine prescribed to you: Requires you to avoid driving or using machinery. Can cause constipation. You may need to take these actions to prevent or treat constipation: Drink enough fluid to keep your urine pale yellow. Take over-the-counter or prescription medicines. Eat foods that are high in fiber, such as beans, whole grains, and fresh fruits and vegetables. Limit foods that are high in fat and processed sugars, such as fried or sweet foods. Incision care  You may need to have a caregiver help you with wound care and dressing changes. Follow instructions from your health care provider about how to take care of your incision. Make sure you: Wash your hands with soap and water for at least 20 seconds before and after you change your bandage (dressing). If soap and water are not available, use hand sanitizer. Change your dressing as told by your health care provider. Leave stitches (sutures), skin glue, or adhesive strips in place. These skin closures may need to stay in place for 2 weeks or longer. If adhesive strip edges start to loosen and curl up, you may trim the loose edges. Do  not remove adhesive strips completely unless your health care provider tells you to do that. Check your incision area every day for signs of infection. If it is hard to see the area, have someone check for you. Check for: More redness, swelling, or pain. More fluid or blood. Warmth. Pus or a bad smell. Managing pain and swelling If directed, put ice on the affected area. To do this: Put ice in a plastic bag. Place a towel between your skin and the bag. Leave the ice on for 20 minutes, 2-3 times a day. If your skin turns bright red, remove the ice right away to prevent skin damage. The risk of skin damage is higher if you cannot feel pain, heat, or cold. Activity Do not do activities that cause pain or irritate the incision area. These may include bike riding, running, sit ups, or anything that involves a twisting motion. Rest as told by your health care provider. Do not sit for a long time without moving. Get up to take short walks every 1-2 hours. This will improve blood flow and breathing. Ask for help if you feel weak or unsteady. Return to your normal activities as told by your health care provider. Ask your health care provider what activities are safe for you. General instructions Do not use any products that contain nicotine or tobacco. These products include cigarettes, chewing tobacco, and vaping devices, such as e-cigarettes. These can delay healing after surgery. If you need help quitting, ask your health care provider. Do not take baths, swim, or use a hot tub until your health care provider approves.   Ask your health care provider if you may take showers. You may only be allowed to take sponge baths. Keep all follow-up visits. This is important to monitor healing. If you had a procedure with wound packing, your packing may be changed or removed at follow-up visits. Contact a health care provider if: You have pain that does not get better with medicine. You have any of these signs  of infection: More redness, swelling, or pain around your incision. More fluid or blood coming from your incision. Warmth coming from your incision. Pus or a bad smell coming from your incision. A fever. Get help right away if: You have severe pain in your abdomen. You have sudden chest pain and shortness of breath. You cough up blood. You faint or lose consciousness. These symptoms may be an emergency. Get help right away. Call 911. Do not wait to see if the symptoms will go away. Do not drive yourself to the hospital. Summary It is common to have some pain and drainage after your procedure. You may have more drainage if you have an open incision. You may need to have a caregiver help you with wound care and dressing changes. Do not do activities that cause pain or irritate the incision area. Contact your health care provider if you have pain that does not get better with medicine or if you have any signs of infection. This information is not intended to replace advice given to you by your health care provider. Make sure you discuss any questions you have with your health care provider. Document Revised: 03/31/2021 Document Reviewed: 03/31/2021 Elsevier Patient Education  2024 Elsevier Inc.  

## 2022-12-17 NOTE — Progress Notes (Signed)
Outpatient Surgical Follow Up  12/17/2022  Joanne Tate is an 42 y.o. female.   Chief Complaint  Patient presents with   Routine Post Op    Pilonidal cyst 11/14/2022    HPI: The patient returns today status post excision of pilonidal cyst.  She reports doing well.  She is initially she had some drainage from the wound but that is healed up.  She denies any pain at the site nor is there any redness.  She denies feeling any recurrence of her disease.  She is tolerating a diet and having normal bowel function.  Past Medical History:  Diagnosis Date   Bipolar 1 disorder (HCC)    Pilonidal cyst    Seizures (HCC)    last one was during teenage years   Tobacco use     Past Surgical History:  Procedure Laterality Date   EYE SURGERY Left    cornea laceration   PILONIDAL CYST EXCISION N/A 11/14/2022   Procedure: CYST EXCISION PILONIDAL EXTENSIVE;  Surgeon: Kandis Cocking, MD;  Location: [facility] ORS;  Service: General;  Laterality: N/A;    Family History  Problem Relation Age of Onset   Hypertension Mother    Thyroid disease Mother    Heart disease Mother    Other Father        unknown medical history   Breast cancer Paternal Aunt    Breast cancer Paternal Aunt    Breast cancer Paternal Aunt     Social History:  reports that she has been smoking cigarettes. She has been exposed to tobacco smoke. She has never used smokeless tobacco. She reports current alcohol use. She reports that she does not use drugs.  Allergies:  Allergies  Allergen Reactions   Cortisone Rash   Hydrocortisone Rash    Medications reviewed.    ROS Full ROS performed and is otherwise negative other than what is stated in HPI   BP 95/62   Pulse 85   Temp 98.4 F (36.9 C) (Oral)   Ht 5\' 5"  (1.651 m)   Wt 129 lb 6.4 oz (58.7 kg)   SpO2 100%   BMI 21.53 kg/m   Physical Exam No acute distress, PERRLA, moving all extremities spontaneously, alert and oriented x 3.  Exam done in the presence of a  chaperone.  The site of excision of her pits has healed well.  There is no further pilonidal disease.  Her previous abscess cavity that was drained is also healing well with minimal induration.  There is no erythema around this and no evidence of recurrence of disease.    No results found for this or any previous visit (from the past 48 hour(s)). No results found.  Assessment/Plan: Patient is status post pilonidal cyst disease excision.  She is doing well and there is no evidence of recurrence of disease.  I did discuss with her given that we did this in a minimally invasive fashion that there is a chance that the pilonidal cyst disease it would recur.  If it does recur then she would likely need a larger procedure or potential flap.  However, it appears that the area is healing well without evidence of recurrence.  She can follow-up with our office on an as-needed basis or if it returns she will call us.   Baker Pierini, M.D. [facility]

## 2023-01-15 ENCOUNTER — Telehealth: Payer: Self-pay | Admitting: General Surgery

## 2023-01-15 ENCOUNTER — Ambulatory Visit (INDEPENDENT_AMBULATORY_CARE_PROVIDER_SITE_OTHER): Payer: Managed Care, Other (non HMO) | Admitting: General Surgery

## 2023-01-15 ENCOUNTER — Encounter: Payer: Self-pay | Admitting: General Surgery

## 2023-01-15 VITALS — BP 98/67 | HR 85 | Temp 98.1°F | Ht 65.0 in | Wt 130.6 lb

## 2023-01-15 DIAGNOSIS — L0591 Pilonidal cyst without abscess: Secondary | ICD-10-CM

## 2023-01-15 NOTE — Telephone Encounter (Signed)
 Left message for patient to call, please inform her of the following regarding scheduled surgery with Dr. Marinda.    Pre-Admission date/time, and Surgery date at [facility].  Surgery Date: 02/06/23 Preadmission Testing Date: 01/29/23 (phone 8a-1p)  Also patient will need to call at [phone], between 1-3:00pm the day before surgery, to find out what time to arrive for surgery.

## 2023-01-15 NOTE — Patient Instructions (Signed)
 You have been seen today for a Pilonidal Cyst.  To keep this cyst as minimal as possible, you will want to shave or use Veet (Hair Remover) on this area to keep as much hair out of the tracts as you can, have a family member pull hair from the tracts if possible, keep the area clean and dry as possible. Wash with soap and water once daily and keep a guaze on this area at all times while draining.  If we excise this area (surgery) in the future, you will need to arrange to be out of work for approximately 1-2 weeks and then have a family member change the dressing 1-2 times daily until this heals from the inside out.   Please call our office with any questions or concerns.    Pilonidal Cyst A pilonidal cyst is a fluid-filled sac. It forms beneath the skin near your tailbone, at the top of the crease of your buttocks. A pilonidal cyst that is not large or infected may not cause symptoms or problems. If the cyst becomes irritated or infected, it may fill with pus. This causes pain and swelling (pilonidal abscess). An infected cyst may need to be treated with medicine, drained, or removed. CAUSES The cause of a pilonidal cyst is not known. One cause may be a hair that grows into your skin (ingrown hair). RISK FACTORS Pilonidal cysts are more common in boys and men. Risk factors include:  Having lots of hair near the crease of the buttocks.  Being overweight.  Having a pilonidal dimple.  Wearing tight clothing.  Not bathing or showering frequently.  Sitting for long periods of time. SIGNS AND SYMPTOMS Signs and symptoms of a pilonidal cyst may include:  Redness.  Pain and tenderness.  Warmth.  Swelling.  Pus.  Fever. DIAGNOSIS Your health care provider may diagnose a pilonidal cyst based on your symptoms and a physical exam. The health care provider may do a blood test to check for infection. If your cyst is draining pus, your health care provider may take a sample of the  drainage to be tested at a laboratory. TREATMENT Surgery is the usual treatment for an infected pilonidal cyst. You may also have to take medicines before surgery. The type of surgery you have depends on the size and severity of the infected cyst. The different kinds of surgery include:  Incision and drainage. This is a procedure to open and drain the cyst.  Marsupialization. In this procedure, a large cyst or abscess may be opened and kept open by stitching the edges of the skin to the cyst walls.  Cyst removal. This procedure involves opening the skin and removing all or part of the cyst. HOME CARE INSTRUCTIONS  Follow all of your surgeon's instructions carefully if you had surgery.  Take medicines only as directed by your health care provider.  If you were prescribed an antibiotic medicine, finish it all even if you start to feel better.  Keep the area around your pilonidal cyst clean and dry.  Clean the area as directed by your health care provider. Pat the area dry with a clean towel. Do not rub it as this may cause bleeding.  Remove hair from the area around the cyst as directed by your health care provider.  Do not wear tight clothing or sit in one place for long periods of time.  There are many different ways to close and cover an incision, including stitches, skin glue, and adhesive strips. Follow  your health care provider's instructions on:  Incision care.  Bandage (dressing) changes and removal.  Incision closure removal. SEEK MEDICAL CARE IF:   You have drainage, redness, swelling, or pain at the site of the cyst.  You have a fever.   This information is not intended to replace advice given to you by your health care provider. Make sure you discuss any questions you have with your health care provider.   Document Released: 12/23/1999 Document Revised: 01/15/2014 Document Reviewed: 05/14/2013 Elsevier Interactive Patient Education [employer]

## 2023-01-16 NOTE — Telephone Encounter (Signed)
 Called patient, she is now informed of all dates regarding surgery.

## 2023-01-16 NOTE — Progress Notes (Signed)
 Patient ID: Joanne Tate, female   DOB: October 23, 1980, 43 y.o.   MRN: [id number] CC: Recurrent pilonidal cyst disease  History of Present Illness Joanne Tate is a 43 y.o. female with past medical history as listed below who returns to clinic today for recurrence of pilonidal cyst disease.  She reports that about a week and a half ago she developed increased pain in her gluteal cleft that was associated with swelling.  This first opened this past weekend and had purulent drainage.  She says that she did get relief then.  She denies any fevers or chills.  She denies any changes in her bowel habits..  Past Medical History Past Medical History:  Diagnosis Date   Bipolar 1 disorder (HCC)    Pilonidal cyst    Seizures (HCC)    last one was during teenage years   Tobacco use       Past Surgical History:  Procedure Laterality Date   EYE SURGERY Left    cornea laceration   PILONIDAL CYST EXCISION N/A 11/14/2022   Procedure: CYST EXCISION PILONIDAL EXTENSIVE;  Surgeon: Marinda Jayson KIDD, MD;  Location: [facility] ORS;  Service: General;  Laterality: N/A;    Allergies  Allergen Reactions   Cortisone Rash   Hydrocortisone Rash    Current Outpatient Medications  Medication Sig Dispense Refill   ALPRAZolam (XANAX) 0.25 MG tablet Take 0.25 mg by mouth daily as needed for anxiety.     Ferrous Sulfate (IRON PO) Take 1 tablet by mouth daily.     loratadine (CLARITIN) 10 MG tablet Take 10 mg by mouth at bedtime.     Multiple Vitamin (MULTIVITAMIN WITH MINERALS) TABS tablet Take 1 tablet by mouth daily.     sertraline (ZOLOFT) 50 MG tablet Take 50 mg by mouth every morning.  0   valproic acid (DEPAKENE) 250 MG capsule Take 500 mg by mouth 2 (two) times daily.  1   VITAMIN D PO Take 1 capsule by mouth daily.     No current facility-administered medications for this visit.    Family History Family History  Problem Relation Age of Onset   Hypertension Mother    Thyroid disease Mother    Heart  disease Mother    Other Father        unknown medical history   Breast cancer Paternal Aunt    Breast cancer Paternal Aunt    Breast cancer Paternal Aunt       Social History Social History   Tobacco Use   Smoking status: Every Day    Current packs/day: 0.50    Types: Cigarettes    Passive exposure: Past   Smokeless tobacco: Never  Vaping Use   Vaping status: Never Used  Substance Use Topics   Alcohol use: Yes    Alcohol/week: 0.0 standard drinks of alcohol    Comment: occasionally   Drug use: No        ROS Full ROS of systems performed and is otherwise negative there than what is stated in the HPI  Physical Exam Blood pressure 98/67, pulse 85, temperature 98.1 F (36.7 C), temperature source Oral, height 5' 5 (1.651 m), weight 130 lb 9.6 oz (59.2 kg), SpO2 99%.  Alert and oriented x 3, normal work of breathing on room air, regular rate and rhythm, abdomen is soft, nontender and nondistended.  Gluteal cleft exam performed in the presence of chaperone.  She does have a small pit where there was likely drainage.  I could  not express any drainage.  There is no fluctuance.  There is no surrounding erythema. Data Reviewed   I have personally reviewed the patient's imaging and medical records.    Assessment/Plan    The patient has a recurrence of her pilonidal cyst disease.  She previously had a minimally invasive pilonidal cyst excision.  When she had that procedure I did discuss with her that there is a chance that this may recur.  Unfortunately, this has happened.  I discussed with her that there are number of options for treatment now.  We could repeat a minimally invasive gipps procedure but there is always a chance that this would recur again.  Another option is to perform a complete excision of the pilonidal cyst but this would require a larger wound that would need packing by someone daily.  I also discussed with the patient that there are some surgeons that we will  perform complex flap procedures for an off midline closure.  However, I do not perform those procedures.  She has elected to proceed with a complete excision of the pilonidal cyst diseased area.  She understands that this will create a wound that will need excellent wound care.  I discussed with her the risk, benefits and alternatives of the procedure including risk of infection, bleeding and recurrence of the disease.  She understands these risks and wishes to proceed     Jayson MALVA Endow 01/16/2023, 8:27 AM

## 2023-01-29 ENCOUNTER — Ambulatory Visit: Payer: Self-pay | Admitting: General Surgery

## 2023-01-29 ENCOUNTER — Other Ambulatory Visit: Payer: Self-pay

## 2023-01-29 ENCOUNTER — Encounter
Admission: RE | Admit: 2023-01-29 | Discharge: 2023-01-29 | Disposition: A | Discharge status: Home / Self Care | Payer: Managed Care, Other (non HMO) | Source: Ambulatory Visit | Attending: General Surgery | Admitting: General Surgery

## 2023-01-29 VITALS — Ht 65.0 in | Wt 128.0 lb

## 2023-01-29 DIAGNOSIS — Z01812 Encounter for preprocedural laboratory examination: Secondary | ICD-10-CM

## 2023-01-29 DIAGNOSIS — L0591 Pilonidal cyst without abscess: Secondary | ICD-10-CM

## 2023-01-29 DIAGNOSIS — Z8669 Personal history of other diseases of the nervous system and sense organs: Secondary | ICD-10-CM

## 2023-01-29 NOTE — Patient Instructions (Addendum)
Your procedure is scheduled on: Wednesday, January 29 Report to the Registration Desk on the 1st floor of the [facility] To find out your arrival time, please call [phone] between 1PM - 3PM on: Tuesday, January 28 If your arrival time is 6:00 am, do not arrive before that time as the [facility] entrance doors do not open until 6:00 am.  REMEMBER: Instructions that are not followed completely may result in serious medical risk, up to and including death; or upon the discretion of your surgeon and anesthesiologist your surgery may need to be rescheduled.  Do not eat or drink after midnight the night before surgery.  No gum chewing or hard candies.  One week prior to surgery: starting January 22 Stop Anti-inflammatories (NSAIDS) such as Advil, Aleve, Ibuprofen, Motrin, Naproxen, Naprosyn and Aspirin based products such as Excedrin, Goody's Powder, BC Powder. Stop ANY OVER THE COUNTER supplements until after surgery. Stop iron, multiple vitamins, vitamin D.  You may however, continue to take Tylenol if needed for pain up until the day of surgery.  Continue taking all of your other prescription medications up until the day of surgery.  ON THE DAY OF SURGERY ONLY TAKE THESE MEDICATIONS WITH SIPS OF WATER:  sertraline (ZOLOFT)  valproic acid (DEPAKENE)   No Alcohol for 24 hours before or after surgery.  No Smoking including e-cigarettes for 24 hours before surgery.  No chewable tobacco products for at least 6 hours before surgery.  No nicotine patches on the day of surgery.  Do not use any "recreational" drugs for at least a week (preferably 2 weeks) before your surgery.  Please be advised that the combination of cocaine and anesthesia may have negative outcomes, up to and including death. If you test positive for cocaine, your surgery will be cancelled.  On the morning of surgery brush your teeth with toothpaste and water, you may rinse your mouth with mouthwash if you  wish. Do not swallow any toothpaste or mouthwash.  Use CHG Soap as directed on instruction sheet.  Do not wear jewelry, make-up, hairpins, clips or nail polish.  For welded (permanent) jewelry: bracelets, anklets, waist bands, etc.  Please have this removed prior to surgery.  If it is not removed, there is a chance that hospital personnel will need to cut it off on the day of surgery.  Do not wear lotions, powders, or perfumes.   Do not shave body hair from the neck down 48 hours before surgery.  Contact lenses, hearing aids and dentures may not be worn into surgery.  Do not bring valuables to the hospital. [facility] is not responsible for any missing/lost belongings or valuables.   Notify your doctor if there is any change in your medical condition (cold, fever, infection).  Wear comfortable clothing (specific to your surgery type) to the hospital.  After surgery, you can help prevent lung complications by doing breathing exercises.  Take deep breaths and cough every 1-2 hours.   If you are being discharged the day of surgery, you will not be allowed to drive home. You will need a responsible individual to drive you home and stay with you for 24 hours after surgery.   If you are taking public transportation, you will need to have a responsible individual with you.  Please call the Pre-admissions Testing Dept. at [phone] if you have any questions about these instructions.  Surgery Visitation Policy:  Patients having surgery or a procedure may have two visitors.  Children under  the age of 34 must have an adult with them who is not the patient.  Temporary Visitor Restrictions Due to increasing cases of flu, RSV and COVID-19: Children ages 56 and under will not be able to visit patients in [facility] hospitals under most circumstances.       Preparing for Surgery with CHLORHEXIDINE GLUCONATE (CHG) Soap  Chlorhexidine Gluconate (CHG) Soap  o An antiseptic  cleaner that kills germs and bonds with the skin to continue killing germs even after washing  o Used for showering the night before surgery and morning of surgery  Before surgery, you can play an important role by reducing the number of germs on your skin.  CHG (Chlorhexidine gluconate) soap is an antiseptic cleanser which kills germs and bonds with the skin to continue killing germs even after washing.  Please do not use if you have an allergy to CHG or antibacterial soaps. If your skin becomes reddened/irritated stop using the CHG.  1. Shower the NIGHT BEFORE SURGERY and the MORNING OF SURGERY with CHG soap.  2. If you choose to wash your hair, wash your hair first as usual with your normal shampoo.  3. After shampooing, rinse your hair and body thoroughly to remove the shampoo.  4. Use CHG as you would any other liquid soap. You can apply CHG directly to the skin and wash gently with a scrungie or a clean washcloth.  5. Apply the CHG soap to your body only from the neck down. Do not use on open wounds or open sores. Avoid contact with your eyes, ears, mouth, and genitals (private parts). Wash face and genitals (private parts) with your normal soap.  6. Wash thoroughly, paying special attention to the area where your surgery will be performed.  7. Thoroughly rinse your body with warm water.  8. Do not shower/wash with your normal soap after using and rinsing off the CHG soap.  9. Pat yourself dry with a clean towel.  10. Wear clean pajamas to bed the night before surgery.  12. Place clean sheets on your bed the night of your first shower and do not sleep with pets.  13. Shower again with the CHG soap on the day of surgery prior to arriving at the hospital.  14. Do not apply any deodorants/lotions/powders.  15. Please wear clean clothes to the hospital.

## 2023-01-30 ENCOUNTER — Inpatient Hospital Stay: Admission: RE | Admit: 2023-01-30 | Payer: Managed Care, Other (non HMO) | Source: Ambulatory Visit

## 2023-02-01 ENCOUNTER — Encounter
Admission: RE | Admit: 2023-02-01 | Discharge: 2023-02-01 | Disposition: A | Discharge status: Home / Self Care | Payer: Managed Care, Other (non HMO) | Source: Ambulatory Visit | Attending: General Surgery | Admitting: General Surgery

## 2023-02-01 ENCOUNTER — Encounter: Payer: Self-pay | Admitting: Urgent Care

## 2023-02-01 DIAGNOSIS — L0591 Pilonidal cyst without abscess: Secondary | ICD-10-CM | POA: Insufficient documentation

## 2023-02-01 DIAGNOSIS — Z8669 Personal history of other diseases of the nervous system and sense organs: Secondary | ICD-10-CM | POA: Insufficient documentation

## 2023-02-01 DIAGNOSIS — Z01812 Encounter for preprocedural laboratory examination: Secondary | ICD-10-CM | POA: Insufficient documentation

## 2023-02-01 LAB — CBC
HCT: 35.6 % — ABNORMAL LOW (ref 36.0–46.0)
Hemoglobin: 11.6 g/dL — ABNORMAL LOW (ref 12.0–15.0)
MCH: 29.8 pg (ref 26.0–34.0)
MCHC: 32.6 g/dL (ref 30.0–36.0)
MCV: 91.5 fL (ref 80.0–100.0)
Platelets: 218 10*3/uL (ref 150–400)
RBC: 3.89 MIL/uL (ref 3.87–5.11)
RDW: 12.8 % (ref 11.5–15.5)
WBC: 4.6 10*3/uL (ref 4.0–10.5)
nRBC: 0 % (ref 0.0–0.2)

## 2023-02-01 LAB — BASIC METABOLIC PANEL
Anion gap: 10 (ref 5–15)
BUN: 13 mg/dL (ref 6–20)
CO2: 26 mmol/L (ref 22–32)
Calcium: 8.9 mg/dL (ref 8.9–10.3)
Chloride: 103 mmol/L (ref 98–111)
Creatinine, Ser: 0.94 mg/dL (ref 0.44–1.00)
GFR, Estimated: >60 mL/min (ref >60)
Glucose, Bld: 88 mg/dL (ref 70–99)
Potassium: 4.2 mmol/L (ref 3.5–5.1)
Sodium: 139 mmol/L (ref 135–145)

## 2023-02-04 ENCOUNTER — Telehealth: Payer: Self-pay | Admitting: General Surgery

## 2023-02-04 NOTE — Telephone Encounter (Signed)
Updated information regarding rescheduled surgery with Dr. Maurine Minister.  Left message for patient to call.    Pre-Admission date/time, and Surgery date at [facility].  Surgery Date: 02/20/23 Preadmission Testing Date: 02/13/23 (phone 1p to 4p)  Also patient will need to call at [phone], between 1-3:00pm the day before surgery, to find out what time to arrive for surgery.

## 2023-02-06 NOTE — Telephone Encounter (Signed)
Called patient back, she is now informed of all dates regarding rescheduled surgery.

## 2023-02-13 ENCOUNTER — Encounter
Admission: RE | Admit: 2023-02-13 | Discharge: 2023-02-13 | Disposition: A | Discharge status: Home / Self Care | Payer: Managed Care, Other (non HMO) | Source: Ambulatory Visit | Attending: General Surgery | Admitting: General Surgery

## 2023-02-13 NOTE — Pre-Procedure Instructions (Signed)
 Surgery January 29 was rescheduled until February 12 due to illness. Call to patient, stated no further illness and ready to have the surgery. No new medications to add to her list. Understands pre-op instructions.

## 2023-02-20 ENCOUNTER — Other Ambulatory Visit: Payer: Self-pay

## 2023-02-20 ENCOUNTER — Ambulatory Visit: Payer: Managed Care, Other (non HMO) | Admitting: Urgent Care

## 2023-02-20 ENCOUNTER — Ambulatory Visit: Payer: Self-pay

## 2023-02-20 ENCOUNTER — Encounter: Payer: Self-pay | Admitting: General Surgery

## 2023-02-20 ENCOUNTER — Ambulatory Visit
Admission: RE | Admit: 2023-02-20 | Discharge: 2023-02-20 | Disposition: A | Discharge status: Home / Self Care | Payer: Managed Care, Other (non HMO) | Attending: General Surgery | Admitting: General Surgery

## 2023-02-20 ENCOUNTER — Encounter
Admission: RE | Disposition: A | Discharge status: Home / Self Care | Payer: Self-pay | Source: Home / Self Care | Attending: General Surgery

## 2023-02-20 DIAGNOSIS — L0591 Pilonidal cyst without abscess: Secondary | ICD-10-CM | POA: Diagnosis present

## 2023-02-20 DIAGNOSIS — F319 Bipolar disorder, unspecified: Secondary | ICD-10-CM | POA: Diagnosis not present

## 2023-02-20 DIAGNOSIS — F17210 Nicotine dependence, cigarettes, uncomplicated: Secondary | ICD-10-CM | POA: Insufficient documentation

## 2023-02-20 DIAGNOSIS — Z79899 Other long term (current) drug therapy: Secondary | ICD-10-CM | POA: Diagnosis not present

## 2023-02-20 DIAGNOSIS — Z01812 Encounter for preprocedural laboratory examination: Secondary | ICD-10-CM

## 2023-02-20 HISTORY — PX: PILONIDAL CYST EXCISION: SHX744

## 2023-02-20 LAB — POCT PREGNANCY, URINE: Preg Test, Ur: NEGATIVE

## 2023-02-20 SURGERY — EXCISION, PILONIDAL CYST, EXTENSIVE
Anesthesia: General

## 2023-02-20 MED ORDER — FENTANYL CITRATE (PF) 100 MCG/2ML IJ SOLN
INTRAMUSCULAR | Status: DC | PRN
  Administered 2023-02-20: 100 ug via INTRAVENOUS

## 2023-02-20 MED ORDER — ROCURONIUM BROMIDE 10 MG/ML (PF) SYRINGE
PREFILLED_SYRINGE | INTRAVENOUS | Status: AC
  Filled 2023-02-20: qty 10

## 2023-02-20 MED ORDER — CEFAZOLIN SODIUM-DEXTROSE 2-4 GM/100ML-% IV SOLN
2.0000 g | INTRAVENOUS | Status: AC
  Administered 2023-02-20: 2 g via INTRAVENOUS

## 2023-02-20 MED ORDER — ROCURONIUM BROMIDE 10 MG/ML (PF) SYRINGE
PREFILLED_SYRINGE | INTRAVENOUS | Status: DC | PRN
  Administered 2023-02-20: 50 mg via INTRAVENOUS

## 2023-02-20 MED ORDER — SUGAMMADEX SODIUM 200 MG/2ML IV SOLN
INTRAVENOUS | Status: DC | PRN
  Administered 2023-02-20: 200 mg via INTRAVENOUS

## 2023-02-20 MED ORDER — MIDAZOLAM HCL 2 MG/2ML IJ SOLN
INTRAMUSCULAR | Status: AC
  Filled 2023-02-20: qty 2

## 2023-02-20 MED ORDER — ACETAMINOPHEN 10 MG/ML IV SOLN
INTRAVENOUS | Status: AC
  Filled 2023-02-20: qty 100

## 2023-02-20 MED ORDER — ACETAMINOPHEN 10 MG/ML IV SOLN
INTRAVENOUS | Status: DC | PRN
  Administered 2023-02-20: 1000 mg via INTRAVENOUS

## 2023-02-20 MED ORDER — CHLORHEXIDINE GLUCONATE CLOTH 2 % EX PADS
6.0000 | MEDICATED_PAD | Freq: Once | CUTANEOUS | Status: AC
  Administered 2023-02-20: 6 via TOPICAL

## 2023-02-20 MED ORDER — OXYCODONE HCL 5 MG PO TABS: 5.0000 mg | ORAL_TABLET | Freq: Four times a day (QID) | ORAL | 0 refills | Status: DC | PRN

## 2023-02-20 MED ORDER — LACTATED RINGERS IV SOLN: INTRAVENOUS | Status: DC

## 2023-02-20 MED ORDER — ORAL CARE MOUTH RINSE: 15.0000 mL | Freq: Once | OROMUCOSAL | Status: AC

## 2023-02-20 MED ORDER — LIDOCAINE HCL (PF) 2 % IJ SOLN
INTRAMUSCULAR | Status: AC
  Filled 2023-02-20: qty 5

## 2023-02-20 MED ORDER — LIDOCAINE HCL (CARDIAC) PF 100 MG/5ML IV SOSY
PREFILLED_SYRINGE | INTRAVENOUS | Status: DC | PRN
  Administered 2023-02-20: 80 mg via INTRAVENOUS

## 2023-02-20 MED ORDER — PROPOFOL 10 MG/ML IV BOLUS
INTRAVENOUS | Status: DC | PRN
  Administered 2023-02-20: 130 mg via INTRAVENOUS

## 2023-02-20 MED ORDER — DROPERIDOL 2.5 MG/ML IJ SOLN: 0.6250 mg | Freq: Once | INTRAMUSCULAR | Status: DC | PRN

## 2023-02-20 MED ORDER — BUPIVACAINE LIPOSOME 1.3 % IJ SUSP
INTRAMUSCULAR | Status: AC
  Filled 2023-02-20: qty 10

## 2023-02-20 MED ORDER — CEFAZOLIN SODIUM-DEXTROSE 2-4 GM/100ML-% IV SOLN
INTRAVENOUS | Status: AC
  Filled 2023-02-20: qty 100

## 2023-02-20 MED ORDER — CHLORHEXIDINE GLUCONATE CLOTH 2 % EX PADS: 6.0000 | MEDICATED_PAD | Freq: Once | CUTANEOUS | Status: DC

## 2023-02-20 MED ORDER — BUPIVACAINE-EPINEPHRINE (PF) 0.5% -1:200000 IJ SOLN
INTRAMUSCULAR | Status: DC | PRN
  Administered 2023-02-20: 20 mL via INTRAMUSCULAR

## 2023-02-20 MED ORDER — FENTANYL CITRATE (PF) 100 MCG/2ML IJ SOLN: 25.0000 ug | INTRAMUSCULAR | Status: DC | PRN

## 2023-02-20 MED ORDER — PROPOFOL 10 MG/ML IV BOLUS
INTRAVENOUS | Status: AC
  Filled 2023-02-20: qty 40

## 2023-02-20 MED ORDER — ONDANSETRON HCL 4 MG/2ML IJ SOLN
INTRAMUSCULAR | Status: DC | PRN
  Administered 2023-02-20 (×2): 4 mg via INTRAVENOUS

## 2023-02-20 MED ORDER — CHLORHEXIDINE GLUCONATE 0.12 % MT SOLN
15.0000 mL | Freq: Once | OROMUCOSAL | Status: AC
  Administered 2023-02-20: 15 mL via OROMUCOSAL

## 2023-02-20 MED ORDER — PHENYLEPHRINE 80 MCG/ML (10ML) SYRINGE FOR IV PUSH (FOR BLOOD PRESSURE SUPPORT)
PREFILLED_SYRINGE | INTRAVENOUS | Status: DC | PRN
  Administered 2023-02-20 (×2): 80 ug via INTRAVENOUS

## 2023-02-20 MED ORDER — CHLORHEXIDINE GLUCONATE 0.12 % MT SOLN
OROMUCOSAL | Status: AC
Start: 2023-02-20 — End: ?
  Filled 2023-02-20: qty 15

## 2023-02-20 MED ORDER — BUPIVACAINE-EPINEPHRINE (PF) 0.5% -1:200000 IJ SOLN
INTRAMUSCULAR | Status: AC
  Filled 2023-02-20: qty 10

## 2023-02-20 MED ORDER — MIDAZOLAM HCL 2 MG/2ML IJ SOLN
INTRAMUSCULAR | Status: DC | PRN
  Administered 2023-02-20: 2 mg via INTRAVENOUS

## 2023-02-20 MED ORDER — GLYCOPYRROLATE 0.2 MG/ML IJ SOLN
INTRAMUSCULAR | Status: DC | PRN
  Administered 2023-02-20: .2 mg via INTRAVENOUS

## 2023-02-20 MED ORDER — DEXAMETHASONE SODIUM PHOSPHATE 10 MG/ML IJ SOLN
INTRAMUSCULAR | Status: DC | PRN
  Administered 2023-02-20: 5 mg via INTRAVENOUS

## 2023-02-20 MED ORDER — PROPOFOL 1000 MG/100ML IV EMUL
INTRAVENOUS | Status: AC
  Filled 2023-02-20: qty 100

## 2023-02-20 MED ORDER — DEXMEDETOMIDINE HCL IN NACL 200 MCG/50ML IV SOLN
INTRAVENOUS | Status: DC | PRN
  Administered 2023-02-20: 8 ug via INTRAVENOUS

## 2023-02-20 MED ORDER — FENTANYL CITRATE (PF) 100 MCG/2ML IJ SOLN
INTRAMUSCULAR | Status: AC
  Filled 2023-02-20: qty 2

## 2023-02-20 SURGICAL SUPPLY — 32 items
BLADE CLIPPER SURG (BLADE) IMPLANT
BLADE SURG 15 STRL LF DISP TIS (BLADE) ×1 IMPLANT
BNDG GAUZE DERMACEA FLUFF 4 (GAUZE/BANDAGES/DRESSINGS) IMPLANT
BRIEF MESH DISP 2XL (UNDERPADS AND DIAPERS) ×1 IMPLANT
DRAPE LAPAROTOMY 100X77 ABD (DRAPES) ×1 IMPLANT
DRSG GAUZE FLUFF 36X18 (GAUZE/BANDAGES/DRESSINGS) ×1 IMPLANT
ELECT REM PT RETURN 9FT ADLT (ELECTROSURGICAL) ×1 IMPLANT
ELECTRODE REM PT RTRN 9FT ADLT (ELECTROSURGICAL) ×1 IMPLANT
GAUZE 4X4 16PLY ~~LOC~~+RFID DBL (SPONGE) IMPLANT
GAUZE SPONGE 4X4 12PLY STRL (GAUZE/BANDAGES/DRESSINGS) ×1 IMPLANT
GLOVE BIOGEL PI IND STRL 7.5 (GLOVE) ×1 IMPLANT
GLOVE SURG SYN 7.0 (GLOVE) ×2 IMPLANT
GLOVE SURG SYN 7.0 PF PI (GLOVE) ×1 IMPLANT
GOWN STRL REUS W/ TWL LRG LVL3 (GOWN DISPOSABLE) ×2 IMPLANT
IV CATH ANGIO 14GX3.25 ORG (MISCELLANEOUS) IMPLANT
MANIFOLD NEPTUNE II (INSTRUMENTS) ×1 IMPLANT
NDL HYPO 22X1.5 SAFETY MO (MISCELLANEOUS) ×1 IMPLANT
NEEDLE HYPO 22X1.5 SAFETY MO (MISCELLANEOUS) ×1 IMPLANT
NS IRRIG 500ML POUR BTL (IV SOLUTION) ×1 IMPLANT
PACK BASIN MINOR ARMC (MISCELLANEOUS) ×1 IMPLANT
PAD ABD DERMACEA PRESS 5X9 (GAUZE/BANDAGES/DRESSINGS) IMPLANT
PUNCH BIOPSY 4MM (MISCELLANEOUS) IMPLANT
PUNCH BIOPSY DISP 4 (MISCELLANEOUS) IMPLANT
SOL PREP PVP 2OZ (MISCELLANEOUS) ×1 IMPLANT
SOLUTION PREP PVP 2OZ (MISCELLANEOUS) ×1 IMPLANT
SUT ETHILON 2 0 FS 18 (SUTURE) IMPLANT
SUT VIC AB 2-0 CT1 (SUTURE) IMPLANT
SUT VIC AB 2-0 CT1 TAPERPNT 27 (SUTURE) IMPLANT
SYR 10ML LL (SYRINGE) ×1 IMPLANT
SYR BULB IRRIG 60ML STRL (SYRINGE) ×1 IMPLANT
TRAP FLUID SMOKE EVACUATOR (MISCELLANEOUS) ×1 IMPLANT
WATER STERILE IRR 500ML POUR (IV SOLUTION) ×1 IMPLANT

## 2023-02-20 NOTE — H&P (Signed)
No changes to below H and P Proceed with excision of pilonidal cyst disease as planned   CC: Recurrent pilonidal cyst disease  History of Present Illness Joanne Tate is a 43 y.o. female with past medical history as listed below who returns to clinic today for recurrence of pilonidal cyst disease.  She reports that about a week and a half ago she developed increased pain in her gluteal cleft that was associated with swelling.  This first opened this past weekend and had purulent drainage.  She says that she did get relief then.  She denies any fevers or chills.  She denies any changes in her bowel habits..   Past Medical History     Past Medical History:  Diagnosis Date   Bipolar 1 disorder (HCC)     Pilonidal cyst     Seizures (HCC)      last one was during teenage years   Tobacco use                   Past Surgical History:  Procedure Laterality Date   EYE SURGERY Left      cornea laceration   PILONIDAL CYST EXCISION N/A 11/14/2022    Procedure: CYST EXCISION PILONIDAL EXTENSIVE;  Surgeon: Kandis Cocking, MD;  Location: [facility] ORS;  Service: General;  Laterality: N/A;          Allergies      Allergies  Allergen Reactions   Cortisone Rash   Hydrocortisone Rash              Current Outpatient Medications  Medication Sig Dispense Refill   ALPRAZolam (XANAX) 0.25 MG tablet Take 0.25 mg by mouth daily as needed for anxiety.       Ferrous Sulfate (IRON PO) Take 1 tablet by mouth daily.       loratadine (CLARITIN) 10 MG tablet Take 10 mg by mouth at bedtime.       Multiple Vitamin (MULTIVITAMIN WITH MINERALS) TABS tablet Take 1 tablet by mouth daily.       sertraline (ZOLOFT) 50 MG tablet Take 50 mg by mouth every morning.   0   valproic acid (DEPAKENE) 250 MG capsule Take 500 mg by mouth 2 (two) times daily.   1   VITAMIN D PO Take 1 capsule by mouth daily.          No current facility-administered medications for this visit.        Family History      Family  History  Problem Relation Age of Onset   Hypertension Mother     Thyroid disease Mother     Heart disease Mother     Other Father          unknown medical history   Breast cancer Paternal Aunt     Breast cancer Paternal Aunt     Breast cancer Paternal Aunt              Social History Social History  Social History         Tobacco Use   Smoking status: Every Day      Current packs/day: 0.50      Types: Cigarettes      Passive exposure: Past   Smokeless tobacco: Never  Vaping Use   Vaping status: Never Used  Substance Use Topics   Alcohol use: Yes      Alcohol/week: 0.0 standard drinks of alcohol      Comment: occasionally   Drug  use: No            ROS Full ROS of systems performed and is otherwise negative there than what is stated in the HPI   Physical Exam Blood pressure 98/67, pulse 85, temperature 98.1 F (36.7 C), temperature source Oral, height 5\' 5"  (1.651 m), weight 130 lb 9.6 oz (59.2 kg), SpO2 99%.   Alert and oriented x 3, normal work of breathing on room air, regular rate and rhythm, abdomen is soft, nontender and nondistended.  Gluteal cleft exam performed in the presence of chaperone.  She does have a small pit where there was likely drainage.  I could not express any drainage.  There is no fluctuance.  There is no surrounding erythema. Data Reviewed     I have personally reviewed the patient's imaging and medical records.     Assessment/Plan Assessment The patient has a recurrence of her pilonidal cyst disease.  She previously had a minimally invasive pilonidal cyst excision.  When she had that procedure I did discuss with her that there is a chance that this may recur.  Unfortunately, this has happened.  I discussed with her that there are number of options for treatment now.  We could repeat a minimally invasive gipps procedure but there is always a chance that this would recur again.  Another option is to perform a complete excision of the  pilonidal cyst but this would require a larger wound that would need packing by someone daily.  I also discussed with the patient that there are some surgeons that we will perform complex flap procedures for an off midline closure.  However, I do not perform those procedures.  She has elected to proceed with a complete excision of the pilonidal cyst diseased area.  She understands that this will create a wound that will need excellent wound care.  I discussed with her the risk, benefits and alternatives of the procedure including risk of infection, bleeding and recurrence of the disease.  She understands these risks and wishes to proceed         Kandis Cocking 01/16/2023, 8:27 AM

## 2023-02-20 NOTE — Anesthesia Postprocedure Evaluation (Signed)
Anesthesia Post Note  Patient: Joanne Tate  Procedure(s) Performed: CYST EXCISION PILONIDAL EXTENSIVE  Patient location during evaluation: PACU Anesthesia Type: General Level of consciousness: awake and alert Pain management: pain level controlled Vital Signs Assessment: post-procedure vital signs reviewed and stable Respiratory status: spontaneous breathing, nonlabored ventilation, respiratory function stable and patient connected to nasal cannula oxygen Cardiovascular status: blood pressure returned to baseline and stable Postop Assessment: no apparent nausea or vomiting Anesthetic complications: no   No notable events documented.   Last Vitals:  Vitals:   02/20/23 0853 02/20/23 0906  BP:  105/71  Pulse: 71 74  Resp: 15 18  Temp:  (!) 36.1 C  SpO2: 99% 100%    Last Pain:  Vitals:   02/20/23 0906  TempSrc: Temporal  PainSc: 0-No pain                 Lenard Simmer

## 2023-02-20 NOTE — Transfer of Care (Signed)
Immediate Anesthesia Transfer of Care Note  Patient: Joanne Tate  Procedure(s) Performed: CYST EXCISION PILONIDAL EXTENSIVE  Patient Location: PACU  Anesthesia Type:General  Level of Consciousness: awake, drowsy, and patient cooperative  Airway & Oxygen Therapy: Patient Spontanous Breathing and Patient connected to face mask oxygen  Post-op Assessment: Report given to RN and Post -op Vital signs reviewed and stable  Post vital signs: Reviewed and stable  Last Vitals:  Vitals Value Taken Time  BP 116/60 02/20/23 0823  Temp    Pulse 85 02/20/23 0828  Resp 21 02/20/23 0828  SpO2 100 % 02/20/23 0828  Vitals shown include unfiled device data.  Last Pain:  Vitals:   02/20/23 0652  TempSrc: Temporal  PainSc: 0-No pain         Complications: No notable events documented.

## 2023-02-20 NOTE — Op Note (Signed)
Operative note  Preoperative diagnosis: Pilonidal cyst disease, recurrent Postoperative diagnosis: Pilonidal cyst disease, recurrent Surgeon: Baker Pierini, MD Procedure: Extensive excision of pilonidal cyst disease EBL: 10 cc Specimen: Pilonidal cyst  After informed consent was obtained the patient was brought to the operating room general endotracheal anesthesia was induced on the hospital gurney and then she was placed prone on the operating room table.  All pressure points were padded correctly and adequately.  The gluteal cleft and upper buttocks were then prepped and draped in the usual sterile fashion.  A surgical timeout was called identifying correct patient, site, side and procedure.  There was an area of previous pilonidal cyst abscess that had healed well and there were areas of previous pit that were identified.  An elliptical incision was made encompassing all of this in the gluteal cleft.  This was taken down to the fascia.  The pits were fully removed and the pilonidal cyst were excised fully.  The ellipse of tissue was then passed off the table as specimen.  Hemostasis was then obtained.  The wound was then infiltrated with 20 cc of liposomal bupivacaine and Marcaine solution.  The wound was then packed with wet-to-dry gauze and dressed with ABD pads and mesh panties.  The patient was then placed supine on the hospital gurney and then extubated.  Prior to termination of the procedure all sponge and instrument counts were correct x 2.  The patient was then transferred to the PACU in good condition

## 2023-02-20 NOTE — Anesthesia Preprocedure Evaluation (Signed)
Anesthesia Evaluation  Patient identified by MRN, date of birth, ID band Patient awake    Reviewed: Allergy & Precautions, H&P , NPO status , Patient's Chart, lab work & pertinent test results, reviewed documented beta blocker date and time   History of Anesthesia Complications Negative for: history of anesthetic complications  Airway Mallampati: III  TM Distance: >3 FB Neck ROM: full    Dental  (+) Dental Advidsory Given, Teeth Intact   Pulmonary neg shortness of breath, neg sleep apnea, neg COPD, neg recent URI, Current Smoker and Patient abstained from smoking.   Pulmonary exam normal breath sounds clear to auscultation       Cardiovascular Exercise Tolerance: Good negative cardio ROS Normal cardiovascular exam Rhythm:regular Rate:Normal     Neuro/Psych Seizures - (as a child, none since teenage years), Well Controlled,  PSYCHIATRIC DISORDERS   Bipolar Disorder      GI/Hepatic negative GI ROS, Neg liver ROS,,,  Endo/Other  negative endocrine ROS    Renal/GU negative Renal ROS  negative genitourinary   Musculoskeletal   Abdominal   Peds  Hematology negative hematology ROS (+)   Anesthesia Other Findings Past Medical History: No date: Bipolar 1 disorder (HCC) No date: Pilonidal cyst No date: Seizures (HCC)     Comment:  last one was during teenage years No date: Tobacco use   Reproductive/Obstetrics negative OB ROS                             Anesthesia Physical Anesthesia Plan  ASA: 2  Anesthesia Plan: General   Post-op Pain Management:    Induction: Intravenous  PONV Risk Score and Plan: 2 and Ondansetron, Dexamethasone, Treatment may vary due to age or medical condition and Midazolam  Airway Management Planned: Oral ETT  Additional Equipment:   Intra-op Plan:   Post-operative Plan: Extubation in OR  Informed Consent: I have reviewed the patients History and  Physical, chart, labs and discussed the procedure including the risks, benefits and alternatives for the proposed anesthesia with the patient or authorized representative who has indicated his/her understanding and acceptance.     Dental Advisory Given  Plan Discussed with: Anesthesiologist, CRNA and Surgeon  Anesthesia Plan Comments:        Anesthesia Quick Evaluation

## 2023-02-20 NOTE — Anesthesia Procedure Notes (Signed)
Procedure Name: Intubation Date/Time: 02/20/2023 7:35 AM  Performed by: Katherine Basset, CRNAPre-anesthesia Checklist: Patient identified, Emergency Drugs available, Suction available and Patient being monitored Patient Re-evaluated:Patient Re-evaluated prior to induction Oxygen Delivery Method: Circle system utilized Preoxygenation: Pre-oxygenation with 100% oxygen Induction Type: IV induction Ventilation: Mask ventilation without difficulty Laryngoscope Size: Miller and 2 Grade View: Grade I Tube type: Oral Tube size: 7.0 mm Number of attempts: 1 Airway Equipment and Method: Stylet, Oral airway, LTA kit utilized and Bite block Placement Confirmation: ETT inserted through vocal cords under direct vision, positive ETCO2 and breath sounds checked- equal and bilateral Secured at: 21 cm Tube secured with: Tape Dental Injury: Teeth and Oropharynx as per pre-operative assessment

## 2023-02-21 ENCOUNTER — Encounter: Payer: Self-pay | Admitting: General Surgery

## 2023-02-21 LAB — SURGICAL PATHOLOGY

## 2023-02-26 ENCOUNTER — Ambulatory Visit (INDEPENDENT_AMBULATORY_CARE_PROVIDER_SITE_OTHER): Payer: Managed Care, Other (non HMO) | Admitting: General Surgery

## 2023-02-26 ENCOUNTER — Encounter: Payer: Self-pay | Admitting: General Surgery

## 2023-02-26 VITALS — BP 94/70 | HR 130 | Ht 65.0 in | Wt 127.0 lb

## 2023-02-26 DIAGNOSIS — Z09 Encounter for follow-up examination after completed treatment for conditions other than malignant neoplasm: Secondary | ICD-10-CM

## 2023-02-26 DIAGNOSIS — L0591 Pilonidal cyst without abscess: Secondary | ICD-10-CM

## 2023-02-26 MED ORDER — OXYCODONE HCL 5 MG PO TABS: 5.0000 mg | ORAL_TABLET | Freq: Three times a day (TID) | ORAL | 0 refills | Status: DC | PRN

## 2023-02-26 NOTE — Patient Instructions (Addendum)
 We will refill your Oxycodone.   You may take 4 Ibuprofen every 8 hours and then rotate this with 2 Extra Strength Tylenol every 8 hours.   Change your dressing every 2 days. Cut just enough Aquacel to fit at the base of your wound then place several gauze over the top. If your gauze becomes saturated change this more often.   We will schedule you for Thursday for a nurse visit to change the wound dressing. You may shower before you are seen, remove all of your dressing and packing first. Rinse well, pat dry, and just place a dry gauze over the area and tape in place.

## 2023-02-28 ENCOUNTER — Ambulatory Visit: Payer: Managed Care, Other (non HMO)

## 2023-03-04 ENCOUNTER — Ambulatory Visit (INDEPENDENT_AMBULATORY_CARE_PROVIDER_SITE_OTHER): Payer: Managed Care, Other (non HMO)

## 2023-03-04 DIAGNOSIS — Z09 Encounter for follow-up examination after completed treatment for conditions other than malignant neoplasm: Secondary | ICD-10-CM

## 2023-03-04 DIAGNOSIS — L0591 Pilonidal cyst without abscess: Secondary | ICD-10-CM

## 2023-03-04 MED ORDER — IBUPROFEN 800 MG PO TABS: 800.0000 mg | ORAL_TABLET | Freq: Three times a day (TID) | ORAL | 1 refills | Status: DC | PRN

## 2023-03-04 NOTE — Progress Notes (Signed)
 Patient came in today for a wound check. The wound is clean, with no signs of infection noted, wound is still very moist. Area packed with 1 sheet of Aquacel and gauze on top and taped in place. Follow up as scheduled.

## 2023-03-06 ENCOUNTER — Ambulatory Visit (INDEPENDENT_AMBULATORY_CARE_PROVIDER_SITE_OTHER): Payer: Managed Care, Other (non HMO)

## 2023-03-06 DIAGNOSIS — Z09 Encounter for follow-up examination after completed treatment for conditions other than malignant neoplasm: Secondary | ICD-10-CM

## 2023-03-06 DIAGNOSIS — L0591 Pilonidal cyst without abscess: Secondary | ICD-10-CM

## 2023-03-06 NOTE — Progress Notes (Signed)
 Patient returns today for wound check after pilonidal cyst excision.  She reports that she is having a lot of pain.  She is packing the wound daily.  She denies any bleeding but does feel that there is a large amount of discharge from the area.  On review of the wound there is some fibrinous tissue exudate on the base of the wound and it is still quite soupy.  I replaced it with Aquacel at the base of the wound and cover this with gauze.  There are no undrained fluid collections and no obvious bleeding.  We will keep the Aquacel in there for 2 to 3 days and place gauze over this.  She is going to come into the office for wound changes.  Will see her in 2 weeks.

## 2023-03-06 NOTE — Progress Notes (Signed)
 Pt came in for wound packing. Applied aquacele in wound and covered with a 4x4 and tape. Pt tolerated well.

## 2023-03-07 ENCOUNTER — Ambulatory Visit (INDEPENDENT_AMBULATORY_CARE_PROVIDER_SITE_OTHER): Payer: Managed Care, Other (non HMO) | Admitting: General Surgery

## 2023-03-07 ENCOUNTER — Encounter: Payer: Self-pay | Admitting: General Surgery

## 2023-03-07 VITALS — BP 100/60 | HR 78 | Temp 98.0°F | Ht 65.0 in | Wt 123.0 lb

## 2023-03-07 DIAGNOSIS — L0591 Pilonidal cyst without abscess: Secondary | ICD-10-CM

## 2023-03-07 DIAGNOSIS — Z09 Encounter for follow-up examination after completed treatment for conditions other than malignant neoplasm: Secondary | ICD-10-CM

## 2023-03-07 NOTE — Progress Notes (Signed)
 Outpatient Surgical Follow Up CC: Pilonidal Cyst Excision 03/07/2023  Joanne Tate is an 43 y.o. female.   Chief Complaint  Patient presents with   Routine Post Op    HPI: The patient returns today status post pilonidal cyst excision.  She has been packing the wound and the last time I saw her we started to put Aquacel in the wound base.  She has been coming to clinic for nurse visits for wound change.  She reports that she is keeping the Aquacel and 2 to 3 days.  She changes the top bandage about twice a day given that it gets a little bit saturated.  She denies any fevers or chills.  She is tolerating a diet and having normal bowel function.  Past Medical History:  Diagnosis Date   Bipolar 1 disorder (HCC)    Pilonidal cyst    Seizures (HCC)    last one was during teenage years   Tobacco use     Past Surgical History:  Procedure Laterality Date   EYE SURGERY Left    cornea laceration   PILONIDAL CYST EXCISION N/A 11/14/2022   Procedure: CYST EXCISION PILONIDAL EXTENSIVE;  Surgeon: Kandis Cocking, MD;  Location: [facility] ORS;  Service: General;  Laterality: N/A;   PILONIDAL CYST EXCISION N/A 02/20/2023   Procedure: CYST EXCISION PILONIDAL EXTENSIVE;  Surgeon: Kandis Cocking, MD;  Location: [facility] ORS;  Service: General;  Laterality: N/A;    Family History  Problem Relation Age of Onset   Hypertension Mother    Thyroid disease Mother    Heart disease Mother    Other Father        unknown medical history   Breast cancer Paternal Aunt    Breast cancer Paternal Aunt    Breast cancer Paternal Aunt     Social History:  reports that she has been smoking cigarettes. She has been exposed to tobacco smoke. She has never used smokeless tobacco. She reports current alcohol use. She reports that she does not use drugs.  Allergies:  Allergies  Allergen Reactions   Cortisone Rash   Hydrocortisone Rash    Medications reviewed.    ROS Full ROS performed and is otherwise  negative other than what is stated in HPI   BP 100/60   Pulse 78   Temp 98 F (36.7 C)   Ht 5\' 5"  (1.651 m)   Wt 123 lb (55.8 kg)   LMP 12/11/2022 (Approximate)   SpO2 99%   BMI 20.47 kg/m   Physical Exam  Alert and oriented x 3, normal work of breathing on room air, mood and affect appropriate, moving all extremity spontaneously, gluteal cleft exam performed in the presence of a chaperone.  The Aquacel was removed.  The wound looks more shallow than when I saw her 2 weeks ago and there is less drainage and it looks less soupy than previous wound change.  There is also good beefy red granulation tissue that is coming in.  The base of the wound I have placed Aquacel Ag in there and cover this with gauze.   No results found for this or any previous visit (from the past 48 hours). No results found.  Assessment/Plan:  Patient is status post pilonidal cyst disease.  We are packing the wound in clinic.  The wound is more shallow and it has less exudative fluid from it.  There is good granulation tissue that is healing in.  We will continue to place Aquacel Ag on  the base and cover this with gauze.  She is going to try to do this by herself at home.  I will see her again in 2 weeks   Baker Pierini, M.D. [facility]

## 2023-03-07 NOTE — Patient Instructions (Signed)
 Follow up here with Dr Maurine Minister in 2 weeks.  Nurse visit on Monday.

## 2023-03-08 ENCOUNTER — Ambulatory Visit: Payer: Managed Care, Other (non HMO)

## 2023-03-11 ENCOUNTER — Telehealth: Payer: Self-pay | Admitting: General Surgery

## 2023-03-11 ENCOUNTER — Ambulatory Visit: Payer: Managed Care, Other (non HMO)

## 2023-03-11 NOTE — Telephone Encounter (Signed)
 Patient cancelled her nurse visit for today.  Did not want to reschedule. Is needing a work note to return to work this Wednesday March 5th 2025 full duty. Please call her when note is ready and she will pick up. Thank you.

## 2023-03-11 NOTE — Telephone Encounter (Signed)
 Spoke with patient and will have [place] fax this to [phone], attention Juicy.

## 2023-03-13 ENCOUNTER — Telehealth: Payer: Self-pay | Admitting: General Surgery

## 2023-03-13 ENCOUNTER — Telehealth: Payer: Self-pay | Admitting: *Deleted

## 2023-03-13 NOTE — Telephone Encounter (Signed)
 Patient wants to know if ok to use the silver calcium alginate dressing for her pilonidal cyst vs the Aquafill as the Aquafill is too expensive.  Please call her.  Thank you.

## 2023-03-13 NOTE — Telephone Encounter (Signed)
 Spoke with Dr Everlene Farrier and he says it is fine to use the Calcium Alginate instead of the Aquacel. Patient notified of this.

## 2023-03-13 NOTE — Telephone Encounter (Signed)
Faxed FMLA to [employer] at [phone] 

## 2023-04-02 ENCOUNTER — Ambulatory Visit (INDEPENDENT_AMBULATORY_CARE_PROVIDER_SITE_OTHER): Payer: Managed Care, Other (non HMO) | Admitting: General Surgery

## 2023-04-02 ENCOUNTER — Encounter: Payer: Self-pay | Admitting: General Surgery

## 2023-04-02 VITALS — BP 99/63 | HR 76 | Temp 98.2°F | Ht 65.0 in | Wt 128.0 lb

## 2023-04-02 DIAGNOSIS — L0591 Pilonidal cyst without abscess: Secondary | ICD-10-CM

## 2023-04-02 DIAGNOSIS — Z09 Encounter for follow-up examination after completed treatment for conditions other than malignant neoplasm: Secondary | ICD-10-CM

## 2023-04-02 MED ORDER — IBUPROFEN 800 MG PO TABS: 800.0000 mg | ORAL_TABLET | Freq: Three times a day (TID) | ORAL | 1 refills | Status: DC | PRN

## 2023-04-02 NOTE — Patient Instructions (Signed)
 Continue to change your dressing daily.  Follow up here in one month.

## 2023-04-02 NOTE — Progress Notes (Signed)
 Outpatient Surgical Follow Up CC: Excision of Pilonidal Cyst Disease 04/02/2023  Joanne Tate is an 43 y.o. female.   Chief Complaint  Patient presents with   Routine Post Op    HPI: Patient returns today status post pilonidal cyst excision.  We are letting the wound heal and by secondary intention.  They are placing Aquacel Ag at the base of the wound.  They report that the wound is healing and well but there is still some pulling sensation.  They deny any drainage from the wound or bleeding.  Pain is well-controlled though with over-the-counter pain medications.  Past Medical History:  Diagnosis Date   Bipolar 1 disorder (HCC)    Pilonidal cyst    Seizures (HCC)    last one was during teenage years   Tobacco use     Past Surgical History:  Procedure Laterality Date   EYE SURGERY Left    cornea laceration   PILONIDAL CYST EXCISION N/A 11/14/2022   Procedure: CYST EXCISION PILONIDAL EXTENSIVE;  Surgeon: Kandis Cocking, MD;  Location: [facility];  Service: General;  Laterality: N/A;   PILONIDAL CYST EXCISION N/A 02/20/2023   Procedure: CYST EXCISION PILONIDAL EXTENSIVE;  Surgeon: Kandis Cocking, MD;  Location: [facility];  Service: General;  Laterality: N/A;    Family History  Problem Relation Age of Onset   Hypertension Mother    Thyroid disease Mother    Heart disease Mother    Other Father        unknown medical history   Breast cancer Paternal Aunt    Breast cancer Paternal Aunt    Breast cancer Paternal Aunt     Social History:  reports that she has been smoking cigarettes. She has been exposed to tobacco smoke. She has never used smokeless tobacco. She reports current alcohol use. She reports that she does not use drugs.  Allergies:  Allergies  Allergen Reactions   Cortisone Rash   Hydrocortisone Rash    Medications reviewed.    ROS Full ROS performed and is otherwise negative other than what is stated in HPI   BP 99/63   Pulse 76   Temp 98.2 F  (36.8 C)   Ht 5\' 5"  (1.651 m)   Wt 128 lb (58.1 kg)   LMP 12/11/2022 (Approximate)   SpO2 99%   BMI 21.30 kg/m   Physical Exam  Alert and oriented x 3, no work of breathing on room air, gluteal cleft exam performed the presence of a chaperone.  At the top of the gluteal cleft there is area of excision.  There is good granulation tissue at the base.  There is no drainage or areas of purulence.  There are no pits although there was 1 hair that was removed from the very inferior part of the excision site.   No results found for this or any previous visit (from the past 48 hours). No results found.  Assessment/Plan:  Patient status post excision of pilonidal cyst disease.  The wound is healing by secondary intention and is healing well.  Continue Aquacel Ag at the base of the wound.  I will see them again in 4 weeks.  I suspect by then the wound will be completely healed.  We will refill the ibuprofen prescription for pain.  Discussed that they can also take Tylenol for pain.  Baker Pierini, M.D. [facility]

## 2023-05-01 ENCOUNTER — Telehealth: Payer: Self-pay | Admitting: General Surgery

## 2023-05-01 NOTE — Telephone Encounter (Signed)
 Just went ahead and called pt  back to see if she could come in tomorrow. She is coming in the afternoon.

## 2023-05-01 NOTE — Telephone Encounter (Signed)
 Pt said their incision is open again. Wants advise as what they need to do. Sx was in feb.Phone [phone].

## 2023-05-02 ENCOUNTER — Encounter: Admitting: General Surgery

## 2023-05-02 ENCOUNTER — Ambulatory Visit: Payer: Self-pay | Admitting: General Surgery

## 2023-05-02 ENCOUNTER — Encounter: Payer: Self-pay | Admitting: General Surgery

## 2023-05-02 VITALS — BP 103/70 | HR 92 | Temp 98.4°F | Ht 66.0 in | Wt 132.0 lb

## 2023-05-02 DIAGNOSIS — L0591 Pilonidal cyst without abscess: Secondary | ICD-10-CM

## 2023-05-02 DIAGNOSIS — Z09 Encounter for follow-up examination after completed treatment for conditions other than malignant neoplasm: Secondary | ICD-10-CM

## 2023-05-02 NOTE — Patient Instructions (Signed)
Please call the office if you have any questions or concerns. 

## 2023-05-02 NOTE — Progress Notes (Signed)
 Outpatient Surgical Follow Up  05/02/2023  Joanne Tate is an 43 y.o. female.   Chief Complaint  Patient presents with   Routine Post Op    Pilonidal cyst     HPI: Patient returns today for wound check after excision of pilonidal cyst.  They report doing well but there was some opening of the incision.  They said that it closed up but then opened when they were straining to have a bowel movement.  There was minimal bleeding and there has been no drainage of purulence.  There is no pain or surrounding erythema.  Past Medical History:  Diagnosis Date   Bipolar 1 disorder (HCC)    Pilonidal cyst    Seizures (HCC)    last one was during teenage years   Tobacco use     Past Surgical History:  Procedure Laterality Date   EYE SURGERY Left    cornea laceration   PILONIDAL CYST EXCISION N/A 11/14/2022   Procedure: CYST EXCISION PILONIDAL EXTENSIVE;  Surgeon: Barrett Lick, MD;  Location: [facility];  Service: General;  Laterality: N/A;   PILONIDAL CYST EXCISION N/A 02/20/2023   Procedure: CYST EXCISION PILONIDAL EXTENSIVE;  Surgeon: Barrett Lick, MD;  Location: [facility];  Service: General;  Laterality: N/A;    Family History  Problem Relation Age of Onset   Hypertension Mother    Thyroid disease Mother    Heart disease Mother    Other Father        unknown medical history   Breast cancer Paternal Aunt    Breast cancer Paternal Aunt    Breast cancer Paternal Aunt     Social History:  reports that she has been smoking cigarettes. She has been exposed to tobacco smoke. She has never used smokeless tobacco. She reports current alcohol use. She reports that she does not use drugs.  Allergies:  Allergies  Allergen Reactions   Cortisone Rash   Hydrocortisone Rash    Medications reviewed.    ROS Full ROS performed and is otherwise negative other than what is stated in HPI   BP 103/70   Pulse 92   Temp 98.4 F (36.9 C) (Oral)   Ht 5\' 6"  (1.676 m)   Wt 132 lb  (59.9 kg)   SpO2 96%   BMI 21.31 kg/m   Physical Exam  Gluteal cleft exam performed in the presence of a chaperone.  The area has good granulation tissue and there is about a centimeter gap where the wound has opened up a little bit.  There is no evidence of recurrence of pilonidal pits.  And the width of the wound is probably only 1 mm.   No results found for this or any previous visit (from the past 48 hours). No results found.  Assessment/Plan:  Status post pilonidal cyst excision.  Doing well there was some opening of the wound off.  Healed up via secondary at intention but the underlying tissue appears healthy with good granulation tissue.  Will plan to do a phone visit in 4 weeks to ensure that it is closed up.  Severa Daniels, M.D. [facility]

## 2023-05-07 ENCOUNTER — Encounter: Admitting: General Surgery

## 2023-05-30 ENCOUNTER — Telehealth (INDEPENDENT_AMBULATORY_CARE_PROVIDER_SITE_OTHER): Payer: Self-pay | Admitting: General Surgery

## 2023-05-30 DIAGNOSIS — Z09 Encounter for follow-up examination after completed treatment for conditions other than malignant neoplasm: Secondary | ICD-10-CM | POA: Diagnosis not present

## 2023-05-30 DIAGNOSIS — L0591 Pilonidal cyst without abscess: Secondary | ICD-10-CM | POA: Diagnosis not present

## 2023-05-30 NOTE — Progress Notes (Signed)
 Patient presented for e-visit with video.  The patient was at her home and I was in the office.  She reports that she is doing well.  She says that the area on her gluteal cleft that we excised is healing well and has healed fully.  She says that there continues to be some tension on the area that she feels pulls a little bit but it is not reopened.  She said there is been no drainage from the area.  She says that there is still little bit of swelling but it seems to be decreasing.  Overall she is doing quite well.  She will call us  if there are any concerns or questions about her healed wound.  A total of 20 minutes was spent with the patient on the phone.

## 2023-07-21 ENCOUNTER — Ambulatory Visit
Admission: EM | Admit: 2023-07-21 | Discharge: 2023-07-21 | Disposition: A | Discharge status: Home / Self Care | Payer: Self-pay

## 2023-07-21 ENCOUNTER — Encounter: Payer: Self-pay | Admitting: Emergency Medicine

## 2023-07-21 ENCOUNTER — Ambulatory Visit (INDEPENDENT_AMBULATORY_CARE_PROVIDER_SITE_OTHER): Payer: Self-pay

## 2023-07-21 DIAGNOSIS — M238X2 Other internal derangements of left knee: Secondary | ICD-10-CM

## 2023-07-21 DIAGNOSIS — M25562 Pain in left knee: Secondary | ICD-10-CM

## 2023-07-21 DIAGNOSIS — M546 Pain in thoracic spine: Secondary | ICD-10-CM

## 2023-07-21 MED ORDER — IBUPROFEN 600 MG PO TABS: 600.0000 mg | ORAL_TABLET | Freq: Four times a day (QID) | ORAL | 0 refills | Status: AC | PRN

## 2023-07-21 MED ORDER — BACLOFEN 10 MG PO TABS: 10.0000 mg | ORAL_TABLET | Freq: Three times a day (TID) | ORAL | 0 refills | Status: AC

## 2023-07-21 NOTE — ED Triage Notes (Signed)
 Patient c/o upper back pain since being diagnosed with Pneumonia back in June.  Patient c/o left knee pain after falling out of bed back in June.  Patient denies any swelling.

## 2023-07-21 NOTE — Discharge Instructions (Addendum)
 Take the ibuprofen , 600 mg every 6 hours with food, on a schedule for the next 48 hours and then as needed.  Taking this with your sertraline does increase your risk of GI bleed so please make sure that you are taking it with a meal and not just a light snack.  Take the baclofen , 10 mg every 8 hours, on a schedule for the next 48 hours and then as needed.  Apply moist heat to your back for 30 minutes at a time 2-3 times a day to improve blood flow to the area and help remove the lactic acid causing the spasm.  Follow the back exercises given at discharge.  Return for reevaluation for any new or worsening symptoms.

## 2023-07-21 NOTE — ED Provider Notes (Addendum)
 [facility]    CSN: [id number] Arrival date & time: 07/21/23  9177      History   Chief Complaint Chief Complaint  Patient presents with   Back Pain   Knee Pain    left    HPI Joanne Tate is a 43 y.o. female.   HPI  43 year old female with past medical history significant for seizure disorder, tobacco use, pollinosis, and bipolar 1 disorder presents for evaluation of 2 complaints.  Her first complaint is that she has been experiencing upper back pain since she was diagnosed with pneumonia by her PCP in late June.  She is no longer coughing but she does endorse that she has some intermittent shortness of breath.  Her second complaint is a pulling sensation underneath her kneecap with ambulation that has been going on since she suffered a ground-level fall onto a carpeted surface around the same time she was diagnosed with pneumonia.  No swelling or bruising noted.  Past Medical History:  Diagnosis Date   Bipolar 1 disorder (HCC)    Pilonidal cyst    Seizures (HCC)    last one was during teenage years   Tobacco use     Patient Active Problem List   Diagnosis Date Noted   Pilonidal cyst of natal cleft 11/16/2022    Past Surgical History:  Procedure Laterality Date   EYE SURGERY Left    cornea laceration   PILONIDAL CYST EXCISION N/A 11/14/2022   Procedure: CYST EXCISION PILONIDAL EXTENSIVE;  Surgeon: Marinda Jayson KIDD, MD;  Location: [facility];  Service: General;  Laterality: N/A;   PILONIDAL CYST EXCISION N/A 02/20/2023   Procedure: CYST EXCISION PILONIDAL EXTENSIVE;  Surgeon: Marinda Jayson KIDD, MD;  Location: [facility];  Service: General;  Laterality: N/A;    OB History   No obstetric history on file.      Home Medications    Prior to Admission medications   Medication Sig Start Date End Date Taking? Authorizing Provider  albuterol (VENTOLIN HFA) 108 (90 Base) MCG/ACT inhaler Inhale 2 puffs into the lungs every 4 (four) hours as needed. 07/08/23  Yes  [provider]  baclofen  (LIORESAL ) 10 MG tablet Take 1 tablet (10 mg total) by mouth 3 (three) times daily. 07/21/23  Yes Bernardino Ditch, NP  ibuprofen  (ADVIL ) 600 MG tablet Take 1 tablet (600 mg total) by mouth every 6 (six) hours as needed. 07/21/23  Yes Bernardino Ditch, NP  valproic acid (DEPAKENE) 250 MG capsule Take 500 mg by mouth 2 (two) times daily. 09/02/17  Yes [provider]  cetirizine  (ZYRTEC ) 10 MG tablet Take 10 mg by mouth daily.    [provider]  Ferrous Sulfate (IRON PO) Take 65 mg by mouth daily.    [provider]  Multiple Vitamin (MULTIVITAMIN WITH MINERALS) TABS tablet Take 1 tablet by mouth daily.    [provider]  sertraline (ZOLOFT) 50 MG tablet Take 100 mg by mouth every morning. 09/02/17   [provider]  VITAMIN D PO Take 25 mcg by mouth daily.    [provider]  fluticasone  (FLONASE ) 50 MCG/ACT nasal spray Place 2 sprays into both nostrils daily. 12/04/17 10/19/18  Lacinda Elsie SQUIBB, PA-C    Family History Family History  Problem Relation Age of Onset   Hypertension Mother    Thyroid disease Mother    Heart disease Mother    Other Father        unknown medical history   Breast  cancer Paternal Aunt    Breast cancer Paternal Aunt    Breast cancer Paternal Aunt     Social History Social History   Tobacco Use   Smoking status: Every Day    Current packs/day: 0.50    Types: Cigarettes    Passive exposure: Past   Smokeless tobacco: Never  Vaping Use   Vaping status: Never Used  Substance Use Topics   Alcohol use: Yes    Alcohol/week: 0.0 standard drinks of alcohol    Comment: occasionally   Drug use: No     Allergies   Cortisone and Hydrocortisone   Review of Systems Review of Systems  Constitutional:  Negative for fever.  Respiratory:  Positive for shortness of breath. Negative for cough and wheezing.   Musculoskeletal:  Positive for arthralgias and back pain. Negative for joint  swelling.  Skin:  Negative for color change.     Physical Exam Triage Vital Signs ED Triage Vitals [07/21/23 0846]  Encounter Vitals Group     BP      Girls Systolic BP Percentile      Girls Diastolic BP Percentile      Boys Systolic BP Percentile      Boys Diastolic BP Percentile      Pulse      Resp      Temp      Temp src      SpO2      Weight 132 lb 0.9 oz (59.9 kg)     Height 5' 6 (1.676 m)     Head Circumference      Peak Flow      Pain Score 7     Pain Loc      Pain Education      Exclude from Growth Chart    No data found.  Updated Vital Signs BP 110/74 (BP Location: Right Arm)   Pulse 80   Temp 98.1 F (36.7 C) (Oral)   Resp 14   Ht 5' 6 (1.676 m)   Wt 132 lb 0.9 oz (59.9 kg)   SpO2 97%   BMI 21.31 kg/m   Visual Acuity Right Eye Distance:   Left Eye Distance:   Bilateral Distance:    Right Eye Near:   Left Eye Near:    Bilateral Near:     Physical Exam Vitals and nursing note reviewed.  Constitutional:      Appearance: Normal appearance. She is not ill-appearing.  HENT:     Head: Normocephalic and atraumatic.  Cardiovascular:     Rate and Rhythm: Normal rate and regular rhythm.     Pulses: Normal pulses.     Heart sounds: Normal heart sounds. No murmur heard.    No friction rub. No gallop.  Pulmonary:     Effort: Pulmonary effort is normal.     Breath sounds: Normal breath sounds. No wheezing, rhonchi or rales.  Musculoskeletal:        General: Tenderness and signs of injury present. No swelling or deformity. Normal range of motion.  Skin:    General: Skin is warm and dry.     Capillary Refill: Capillary refill takes less than 2 seconds.     Findings: No bruising or erythema.  Neurological:     General: No focal deficit present.     Mental Status: She is alert and oriented to person, place, and time.      UC Treatments / Results  Labs (all labs ordered are listed, but only abnormal  results are displayed) Labs Reviewed - No  data to display  EKG   Radiology No results found.  Procedures Procedures (including critical care time)  Medications Ordered in UC Medications - No data to display  Initial Impression / Assessment and Plan / UC Course  I have reviewed the triage vital signs and the nursing notes.  Pertinent labs & imaging results that were available during my care of the patient were reviewed by me and considered in my medical decision making (see chart for details).   Patient is a pleasant, nontoxic-appearing 43 year old female presenting for evaluation of upper back pain and left knee pain as outlined in the HPI above.  The pain in her back has been present since she was diagnosed with pneumonia several weeks ago.  She reports that her cough is largely resolved she still has some mild residual shortness of breath.  In the exam room she is able to speak in full sentences without dyspnea or tachypnea.  Respiratory rate at triage was 14 with a 97% room air oxygen saturation.  Her lungs are clear to auscultation all fields.  She does have significant tenderness and mild muscle tension in the middle and lower trapezius muscle on the right-hand side.  Her scapula is also tight against her chest wall and I suspect she may have some inflammation of her subscapularis.  The pain does increase with movement.  She also reports that the pain is worse with cold.  Pressure does tend to improve her pain.    With regards to her knee, she suffered a ground-level fall onto a carpeted surface about the same time she was diagnosed with pneumonia and she is reporting a pulling sensation underneath her kneecap with ambulation.  In the exam room her knee is in normal anatomical alignment and she has no pain with palpation of the patella, patellar tendon, tibial tuberosity, medial or lateral joint line, or palpation of the quadriceps complex.  Anterior and posterior drawer negative.  There is crepitus underneath the kneecap with  passive range of motion of the knee.  I will obtain a radiograph of her left knee to evaluate for the presence of degeneration or possible bone fragments given the recent fall.  Left knee x-rays independently reviewed and evaluated by me.  Impression: No evidence of fracture or dislocation.  Mild degenerative changes in the medial and lateral compartments.  Mild degeneration to the surface of the patella.  No radiolucent foreign bodies.  Radiology overread is pending. Radiology impression states no acute fracture or dislocation.  I will discharge patient home with a diagnosis of musculoskeletal back pain and left knee pain.  I will start her on 600 mg of ibuprofen  every 6 hours to help with pain and inflammation in both her back and her knee.  Additionally, I will prescribe 10 mg of baclofen  to help with muscle pain and tension.  We discussed moist heat application and I will also provide her with home physical therapy exercises to perform.  If her symptoms do not improve she should follow-up with orthopedics.   Final Clinical Impressions(s) / UC Diagnoses   Final diagnoses:  Crepitus of joint of left knee  Acute right-sided thoracic back pain  Acute pain of left knee     Discharge Instructions      Take the ibuprofen , 600 mg every 6 hours with food, on a schedule for the next 48 hours and then as needed.  Taking this with your sertraline does increase your risk  of GI bleed so please make sure that you are taking it with a meal and not just a light snack.  Take the baclofen , 10 mg every 8 hours, on a schedule for the next 48 hours and then as needed.  Apply moist heat to your back for 30 minutes at a time 2-3 times a day to improve blood flow to the area and help remove the lactic acid causing the spasm.  Follow the back exercises given at discharge.  Return for reevaluation for any new or worsening symptoms.      ED Prescriptions     Medication Sig Dispense Auth. Provider    ibuprofen  (ADVIL ) 600 MG tablet Take 1 tablet (600 mg total) by mouth every 6 (six) hours as needed. 30 tablet Bernardino Ditch, NP   baclofen  (LIORESAL ) 10 MG tablet Take 1 tablet (10 mg total) by mouth 3 (three) times daily. 30 each Bernardino Ditch, NP      PDMP not reviewed this encounter.   Bernardino Ditch, NP 07/21/23 9071    Bernardino Ditch, NP 07/21/23 1003

## 2023-07-22 ENCOUNTER — Ambulatory Visit (HOSPITAL_COMMUNITY): Payer: Self-pay
